# Patient Record
Sex: Male | Born: 1940 | Race: White | Hispanic: No | Marital: Single | Smoking: Former smoker
Health system: Southern US, Community
[De-identification: ages and names within clinical notes are randomized; demographics above are authoritative.]

---

## 2019-08-14 ENCOUNTER — Telehealth: Payer: Self-pay | Admitting: Gastroenterology

## 2019-08-14 NOTE — Telephone Encounter (Signed)
Thank you. I will review them when they are available.

## 2019-08-14 NOTE — Telephone Encounter (Signed)
Hi Dr. Tarri Glenn, we have received a referral from pt's PCP for a repeat colon. Pt had colon in 2013, his records will be placed on your desk for review. Please advise on scheduling.Thank you.

## 2019-08-15 ENCOUNTER — Encounter: Payer: Self-pay | Admitting: Gastroenterology

## 2019-08-18 ENCOUNTER — Telehealth: Payer: Self-pay | Admitting: Gastroenterology

## 2019-09-17 ENCOUNTER — Ambulatory Visit: Payer: Self-pay | Admitting: Gastroenterology

## 2020-01-19 ENCOUNTER — Ambulatory Visit (INDEPENDENT_AMBULATORY_CARE_PROVIDER_SITE_OTHER): Payer: Medicare Other | Admitting: Internal Medicine

## 2020-01-19 ENCOUNTER — Other Ambulatory Visit: Payer: Self-pay

## 2020-02-12 ENCOUNTER — Ambulatory Visit (INDEPENDENT_AMBULATORY_CARE_PROVIDER_SITE_OTHER): Payer: Medicare Other | Admitting: Internal Medicine

## 2020-02-12 ENCOUNTER — Other Ambulatory Visit: Payer: Self-pay

## 2020-02-12 ENCOUNTER — Encounter (INDEPENDENT_AMBULATORY_CARE_PROVIDER_SITE_OTHER): Payer: Self-pay | Admitting: Internal Medicine

## 2020-02-12 ENCOUNTER — Other Ambulatory Visit (INDEPENDENT_AMBULATORY_CARE_PROVIDER_SITE_OTHER): Payer: Self-pay | Admitting: Internal Medicine

## 2020-02-12 VITALS — BP 126/80 | HR 87 | Temp 97.6°F | Resp 18 | Ht 73.0 in | Wt 191.8 lb

## 2020-02-12 DIAGNOSIS — Z1211 Encounter for screening for malignant neoplasm of colon: Secondary | ICD-10-CM

## 2020-02-12 DIAGNOSIS — Z125 Encounter for screening for malignant neoplasm of prostate: Secondary | ICD-10-CM | POA: Diagnosis not present

## 2020-02-12 DIAGNOSIS — E119 Type 2 diabetes mellitus without complications: Secondary | ICD-10-CM | POA: Diagnosis not present

## 2020-02-12 DIAGNOSIS — I10 Essential (primary) hypertension: Secondary | ICD-10-CM

## 2020-02-12 DIAGNOSIS — R351 Nocturia: Secondary | ICD-10-CM

## 2020-02-12 DIAGNOSIS — E559 Vitamin D deficiency, unspecified: Secondary | ICD-10-CM

## 2020-02-12 DIAGNOSIS — E785 Hyperlipidemia, unspecified: Secondary | ICD-10-CM

## 2020-02-12 DIAGNOSIS — E782 Mixed hyperlipidemia: Secondary | ICD-10-CM

## 2020-02-12 HISTORY — DX: Type 2 diabetes mellitus without complications: E11.9

## 2020-02-12 HISTORY — DX: Hyperlipidemia, unspecified: E78.5

## 2020-02-12 HISTORY — DX: Essential (primary) hypertension: I10

## 2020-02-12 MED ORDER — METFORMIN HCL 500 MG PO TABS: 500.0000 mg | ORAL_TABLET | Freq: Two times a day (BID) | ORAL | 0 refills | Status: DC

## 2020-02-12 MED ORDER — SIMVASTATIN 40 MG PO TABS: 40.0000 mg | ORAL_TABLET | Freq: Every day | ORAL | 0 refills | Status: DC

## 2020-02-12 MED ORDER — LISINOPRIL 20 MG PO TABS: 20.0000 mg | ORAL_TABLET | Freq: Every day | ORAL | 0 refills | Status: DC

## 2020-02-12 NOTE — Progress Notes (Signed)
Metrics: Intervention Frequency ACO  Documented Smoking Status Yearly  Screened one or more times in 24 months  Cessation Counseling or  Active cessation medication Past 24 months  Past 24 months   Guideline developer: UpToDate (See UpToDate for funding source) Date Released: 2014       Wellness Office Visit  Subjective:  Patient ID: Adam Erickson, male    DOB: January 25, 1941  Age: 79 y.o. MRN: [id number]  CC: This 79 year old man comes to our practice as a new patient to be established. HPI  He is originally from [place].  He has diabetes, hypertension and hyperlipidemia.  Thankfully, he has no history of coronary artery disease, cerebrovascular disease. He does tell me he has a history of osteoporosis.  He does not have a history of any fracture. Past Medical History:  Diagnosis Date  . Diabetes mellitus without complication ([place]) [id number]  . Essential hypertension, benign 02/12/2020  . HLD (hyperlipidemia) 02/12/2020      Family History  Problem Relation Age of Onset  . Heart disease Mother   . Cancer Father   . Dementia Sister   . Cancer Brother   . Stroke Brother     Social History   Social History Narrative   Divorced in [place].Lives alone.Retired.High School Psychologist, prison and probation services.   Social History   Tobacco Use  . Smoking status: Former Smoker    Types: Cigars  . Smokeless tobacco: Never Used  Substance Use Topics  . Alcohol use: Not Currently    Current Meds  Medication Sig  . lisinopril (ZESTRIL) 20 MG tablet Take 20 mg by mouth daily.  . metFORMIN (GLUCOPHAGE) 500 MG tablet Take by mouth 2 (two) times daily with a meal.  . simvastatin (ZOCOR) 40 MG tablet Take 40 mg by mouth daily.      Objective:   Today's Vitals: BP 126/80 (BP Location: Right Arm, Patient Position: Sitting, Cuff Size: Normal)   Pulse 87   Temp 97.6 F (36.4 C) (Temporal)   Resp 18   Ht 6\' 1"  (1.854 m)   Wt 191 lb 12.8 oz (87 kg)   SpO2 99% Comment: wearing mask  BMI 25.30 kg/m    Vitals with BMI 02/12/2020  Height 6\' 1"   Weight 191 lbs 13 oz  BMI 123XX123  Systolic 123XX123  Diastolic 80  Pulse 87     Physical Exam   He looks systemically well.  Blood pressure is well controlled for his age.  He appears to be alert and orientated.    Assessment   1. Special screening for malignant neoplasm of prostate   2. Essential hypertension, benign   3. Diabetes mellitus without complication ([place])   4. Mixed hyperlipidemia   5. Vitamin D deficiency disease   6. Nocturia    7. Colon cancer screening       Tests ordered Orders Placed This Encounter  Procedures  . CBC  . COMPLETE METABOLIC PANEL WITH GFR  . Hemoglobin A1c  . Lipid panel  . PSA  . VITAMIN D 25 Hydroxy (Vit-D Deficiency, Fractures)  . T3, free  . TSH  . T4  . Fecal Globin By Immunochemistry     Plan: 1. Blood work is ordered above. 2. I have given him fecal occult blood testing as he does not wish to have a colonoscopy. 3. He will continue with all medications for his chronic conditions above.  These appear to be stable based on the medications he is taking but we will see what the  blood work shows.  He will continue with Metformin for his diabetes, lisinopril for his hypertension and statin therapy for his hyperlipidemia. 4. He does also apparently have a history of vitamin D deficiency and I will see what the results are before prescribing an appropriate dose for vitamin D3. 5. Further recommendations will depend on blood results and I will see him in about 2 to 3 weeks time for follow-up.   No orders of the defined types were placed in this encounter.   Doree Albee, MD

## 2020-02-13 LAB — COMPLETE METABOLIC PANEL WITH GFR
AG Ratio: 1.4 (calc) (ref 1.0–2.5)
ALT: 11 U/L (ref 9–46)
AST: 15 U/L (ref 10–35)
Albumin: 4.2 g/dL (ref 3.6–5.1)
Alkaline phosphatase (APISO): 107 U/L (ref 35–144)
BUN: 15 mg/dL (ref 7–25)
CO2: 27 mmol/L (ref 20–32)
Calcium: 9.4 mg/dL (ref 8.6–10.3)
Chloride: 105 mmol/L (ref 98–110)
Creat: 0.97 mg/dL (ref 0.70–1.18)
GFR, Est African American: 86 mL/min/{1.73_m2} (ref >=60)
GFR, Est Non African American: 74 mL/min/{1.73_m2} (ref >=60)
Globulin: 3 g/dL (calc) (ref 1.9–3.7)
Glucose, Bld: 252 mg/dL — ABNORMAL HIGH (ref 65–99)
Potassium: 4.7 mmol/L (ref 3.5–5.3)
Sodium: 139 mmol/L (ref 135–146)
Total Bilirubin: 0.4 mg/dL (ref 0.2–1.2)
Total Protein: 7.2 g/dL (ref 6.1–8.1)

## 2020-02-13 LAB — HEMOGLOBIN A1C
Hgb A1c MFr Bld: 10.2 % of total Hgb — ABNORMAL HIGH (ref <5.7)
Mean Plasma Glucose: 246 (calc)
eAG (mmol/L): 13.6 (calc)

## 2020-02-13 LAB — PSA: PSA: 11.4 ng/mL — ABNORMAL HIGH (ref <=4.0)

## 2020-02-13 LAB — CBC
HCT: 39.9 % (ref 38.5–50.0)
Hemoglobin: 12.9 g/dL — ABNORMAL LOW (ref 13.2–17.1)
MCH: 28.7 pg (ref 27.0–33.0)
MCHC: 32.3 g/dL (ref 32.0–36.0)
MCV: 88.7 fL (ref 80.0–100.0)
MPV: 10 fL (ref 7.5–12.5)
Platelets: 293 10*3/uL (ref 140–400)
RBC: 4.5 10*6/uL (ref 4.20–5.80)
RDW: 11.8 % (ref 11.0–15.0)
WBC: 6.5 10*3/uL (ref 3.8–10.8)

## 2020-02-13 LAB — T4: T4, Total: 8.2 ug/dL (ref 4.9–10.5)

## 2020-02-13 LAB — VITAMIN D 25 HYDROXY (VIT D DEFICIENCY, FRACTURES): Vit D, 25-Hydroxy: 19 ng/mL — ABNORMAL LOW (ref 30–100)

## 2020-02-13 LAB — TSH: TSH: 2.01 mIU/L (ref 0.40–4.50)

## 2020-02-13 LAB — T3, FREE: T3, Free: 2.9 pg/mL (ref 2.3–4.2)

## 2020-03-09 ENCOUNTER — Ambulatory Visit (INDEPENDENT_AMBULATORY_CARE_PROVIDER_SITE_OTHER): Payer: Medicare Other | Admitting: Internal Medicine

## 2020-03-09 ENCOUNTER — Other Ambulatory Visit: Payer: Self-pay

## 2020-03-09 ENCOUNTER — Encounter (INDEPENDENT_AMBULATORY_CARE_PROVIDER_SITE_OTHER): Payer: Self-pay | Admitting: Internal Medicine

## 2020-03-09 VITALS — BP 120/80 | HR 60 | Temp 98.2°F | Ht 73.0 in | Wt 190.2 lb

## 2020-03-09 DIAGNOSIS — R972 Elevated prostate specific antigen [PSA]: Secondary | ICD-10-CM | POA: Diagnosis not present

## 2020-03-09 DIAGNOSIS — Z1211 Encounter for screening for malignant neoplasm of colon: Secondary | ICD-10-CM

## 2020-03-09 DIAGNOSIS — E559 Vitamin D deficiency, unspecified: Secondary | ICD-10-CM | POA: Diagnosis not present

## 2020-03-09 DIAGNOSIS — E119 Type 2 diabetes mellitus without complications: Secondary | ICD-10-CM

## 2020-03-09 NOTE — Progress Notes (Signed)
Metrics: Intervention Frequency ACO  Documented Smoking Status Yearly  Screened one or more times in 24 months  Cessation Counseling or  Active cessation medication Past 24 months  Past 24 months   Guideline developer: UpToDate (See UpToDate for funding source) Date Released: 2014       Wellness Office Visit  Subjective:  Patient ID: Adam Erickson, male    DOB: 1941-03-08  Age: 79 y.o. MRN: [id number]  CC: This man comes in to review all his blood work from the last visit and further recommendations. HPI  His diabetes is poorly controlled with a hemoglobin A1c of over 10%.  He has not been consistent with taking Metformin twice a day and he will do so now. He also has vitamin D deficiency which is very severe. He also has an elevated PSA above the level which is worrisome as he has a family history of prostate cancer.  He is also anemic which is mild.  It is normocytic. Past Medical History:  Diagnosis Date  . Diabetes mellitus without complication ([place]) A999333  . Essential hypertension, benign 02/12/2020  . HLD (hyperlipidemia) 02/12/2020      Family History  Problem Relation Age of Onset  . Heart disease Mother   . Cancer Father   . Dementia Sister   . Cancer Brother   . Stroke Brother     Social History   Social History Narrative   Divorced in [place].Lives alone.Retired.High School Psychologist, prison and probation services.   Social History   Tobacco Use  . Smoking status: Former Smoker    Types: Cigars  . Smokeless tobacco: Never Used  Substance Use Topics  . Alcohol use: Not Currently    Current Meds  Medication Sig  . lisinopril (ZESTRIL) 20 MG tablet Take 1 tablet (20 mg total) by mouth daily.  . metFORMIN (GLUCOPHAGE) 500 MG tablet Take 1 tablet (500 mg total) by mouth 2 (two) times daily with a meal.  . simvastatin (ZOCOR) 40 MG tablet Take 1 tablet (40 mg total) by mouth daily.       Objective:   Today's Vitals: BP 120/80 (BP Location: Left Arm, Patient Position:  Sitting, Cuff Size: Normal)   Pulse 60   Temp 98.2 F (36.8 C) (Temporal)   Ht 6\' 1"  (1.854 m)   Wt 190 lb 3.2 oz (86.3 kg)   SpO2 97%   BMI 25.09 kg/m  Vitals with BMI 03/09/2020 02/12/2020  Height 6\' 1"  6\' 1"   Weight 190 lbs 3 oz 191 lbs 13 oz  BMI 99991111 123XX123  Systolic 123456 123XX123  Diastolic 80 80  Pulse 60 87     Physical Exam   He looks systemically well.  Blood pressure is well controlled.  Weight is stable.    Assessment   1. Elevated PSA   2. Diabetes mellitus without complication ([place])   3. Vitamin D deficiency disease   4. Colon cancer screening       Tests ordered Orders Placed This Encounter  Procedures  . PSA, total and free  . Ambulatory referral to Urology  . Ambulatory referral to Gastroenterology     Plan: 1. I discussed all the results in detail. 2. I am concerned about his elevated PSA and we will check a total PSA again and free PSA to see and get a better idea of whether the elevation in PSA is likely malignant or not.  Also I will refer him to the urologist in the meantime. 3. He wishes to have a  colonoscopy and I will refer him to gastroenterology for screening colonoscopy. 4. I recommended he start taking vitamin D3 10,000 units daily for vitamin D deficiency. 5. In terms of his diabetes, he will start taking Metformin twice a day as he should have been and also modify his diet.  He will cut out sugary foods, make sure he is drinking plenty of water every day and we discussed the concept of intermittent fasting and if he can do this for 16 hours on a daily basis, he will start to get his diabetes under better control.  At his age, I am not going to aim for very strict control as he seems to have really avoided many of severe complications of poor control of diabetes. 6. Follow-up in about 3 months. 7. Today I spent 30 minutes with this patient, reviewing all his results and discussing them in detail.   No orders of the defined types were placed  in this encounter.   Doree Albee, MD

## 2020-03-09 NOTE — Patient Instructions (Signed)
[facility] Dietary Recommendations for Weight Loss What to Avoid . Avoid added sugars o Often added sugar can be found in processed foods such as many condiments, dry cereals, cakes, cookies, chips, crisps, crackers, candies, sweetened drinks, etc.  o Read labels and AVOID/DECREASE use of foods with the following in their ingredient list: Sugar, fructose, high fructose corn syrup, sucrose, glucose, maltose, dextrose, molasses, cane sugar, brown sugar, any type of syrup, agave nectar, etc.   . Avoid snacking in between meals . Avoid foods made with flour o If you are going to eat food made with flour, choose those made with whole-grains; and, minimize your consumption as much as is tolerable . Avoid processed foods o These foods are generally stocked in the middle of the grocery store. Focus on shopping on the perimeter of the grocery.  . Avoid Meat  o We recommend following a plant-based diet at [facility]. Thus, we recommend avoiding meat as a general rule. Consider eating beans, legumes, eggs, and/or dairy products for regular protein sources o If you plan on eating meat limit to 4 ounces of meat at a time and choose lean options such as Fish, chicken, turkey. Avoid red meat intake such as pork and/or steak What to Include . Vegetables o GREEN LEAFY VEGETABLES: Kale, spinach, mustard greens, collard greens, cabbage, broccoli, etc. o OTHER: Asparagus, cauliflower, eggplant, carrots, peas, Brussel sprouts, tomatoes, bell peppers, zucchini, beets, cucumbers, etc. . Grains, seeds, and legumes o Beans: kidney beans, black eyed peas, garbanzo beans, black beans, pinto beans, etc. o Whole, unrefined grains: brown rice, barley, bulgur, oatmeal, etc. . Healthy fats  o Avoid highly processed fats such as vegetable oil o Examples of healthy fats: avocado, olives, virgin olive oil, dark chocolate (?72% Cocoa), nuts (peanuts, almonds, walnuts, cashews, pecans, etc.) . None to Low  Intake of Animal Sources of Protein o Meat sources: chicken, turkey, salmon, tuna. Limit to 4 ounces of meat at one time. o Consider limiting dairy sources, but when choosing dairy focus on: PLAIN Greek yogurt, cottage cheese, high-protein milk . Fruit o Choose berries  When to Eat . Intermittent Fasting: o Choosing not to eat for a specific time period, but DO FOCUS ON HYDRATION when fasting o Multiple Techniques: - Time Restricted Eating: eat 3 meals in a day, each meal lasting no more than 60 minutes, no snacks between meals - 16-18 hour fast: fast for 16 to 18 hours up to 7 days a week. Often suggested to start with 2-3 nonconsecutive days per week.  . Remember the time you sleep is counted as fasting.  . Examples of eating schedule: Fast from 7:00pm-11:00am. Eat between 11:00am-7:00pm.  - 24-hour fast: fast for 24 hours up to every other day. Often suggested to start with 1 day per week . Remember the time you sleep is counted as fasting . Examples of eating schedule:  o Eating day: eat 2-3 meals on your eating day. If doing 2 meals, each meal should last no more than 90 minutes. If doing 3 meals, each meal should last no more than 60 minutes. Finish last meal by 7:00pm. o Fasting day: Fast until 7:00pm.  o IF YOU FEEL UNWELL FOR ANY REASON/IN ANY WAY WHEN FASTING, STOP FASTING BY EATING A NUTRITIOUS SNACK OR LIGHT MEAL o ALWAYS FOCUS ON HYDRATION DURING FASTS - Acceptable Hydration sources: water, broths, tea/coffee (black tea/coffee is best but using a small amount of whole-fat dairy products in coffee/tea is acceptable).  -   Poor Hydration Sources: anything with sugar or artificial sweeteners added to it  These recommendations have been developed for patients that are actively receiving medical care from either Dr. Normagene Harvie or Sarah Gray, DNP, NP-C at [facility]. These recommendations are developed for patients with specific medical conditions and are not meant to be  distributed or used by others that are not actively receiving care from either provider listed above at [facility]. It is not appropriate to participate in the above eating plans without proper medical supervision.   Reference: Fung, J. The obesity code. [place]: [place]; 2016.   VITAMIN D3 10,000 UNITS/DAY 

## 2020-03-10 ENCOUNTER — Encounter (INDEPENDENT_AMBULATORY_CARE_PROVIDER_SITE_OTHER): Payer: Self-pay | Admitting: *Deleted

## 2020-03-10 LAB — PSA, TOTAL AND FREE
PSA, % Free: 38 % (calc) (ref >25)
PSA, Free: 1 ng/mL
PSA, Total: 2.6 ng/mL (ref <=4.0)

## 2020-03-11 NOTE — Progress Notes (Signed)
Patient called. Instruction to patient and let him know that his PSA results are completely normal now and he already has an appointment with urology so it is his choice whether he wants to keep it or cancel it. Pt will canel apptt and see [place]  on next visit. Follow-up as scheduled

## 2020-04-21 ENCOUNTER — Ambulatory Visit: Payer: Medicare Other | Admitting: Urology

## 2020-05-26 ENCOUNTER — Other Ambulatory Visit (INDEPENDENT_AMBULATORY_CARE_PROVIDER_SITE_OTHER): Payer: Self-pay | Admitting: *Deleted

## 2020-05-26 DIAGNOSIS — Z86010 Personal history of colonic polyps: Secondary | ICD-10-CM

## 2020-05-27 ENCOUNTER — Telehealth (INDEPENDENT_AMBULATORY_CARE_PROVIDER_SITE_OTHER): Payer: Self-pay | Admitting: *Deleted

## 2020-05-27 ENCOUNTER — Encounter (INDEPENDENT_AMBULATORY_CARE_PROVIDER_SITE_OTHER): Payer: Self-pay | Admitting: *Deleted

## 2020-05-27 NOTE — Telephone Encounter (Signed)
Patient needs Plenvu (copay card) ° °

## 2020-05-28 ENCOUNTER — Telehealth (INDEPENDENT_AMBULATORY_CARE_PROVIDER_SITE_OTHER): Payer: Self-pay | Admitting: *Deleted

## 2020-05-28 MED ORDER — PLENVU 140 G PO SOLR
1.0000 | Freq: Once | ORAL | 0 refills | Status: AC
Start: 2020-05-28 — End: 2020-05-28

## 2020-05-28 NOTE — Telephone Encounter (Signed)
Referring MD/PCP: gosrani   Procedure: tcs  Reason/Indication:  Hx polyps  Has patient had this procedure before?  Yes, not sure  If so, when, by whom and where?    Is there a family history of colon cancer?  no  Who?  What age when diagnosed?    Is patient diabetic?   yes      Does patient have prosthetic heart valve or mechanical valve?  no  Do you have a pacemaker/defibrillator?  no  Has patient ever had endocarditis/atrial fibrillation? no  Does patient use oxygen? no  Has patient had joint replacement within last 12 months?  no  Is patient constipated or do they take laxatives? no  Does patient have a history of alcohol/drug use?  no  Is patient on blood thinner such as Coumadin, Plavix and/or Aspirin? no  Medications: metformin 500 mg bid, lisinopril 20 mg daily, simvastatin 40 mg daily  Allergies: nkda  Medication Adjustment per Dr Rehman/Dr Jenetta Downer hold diabetic meds evening before and morning of  Procedure date & time: 07/08/20

## 2020-06-09 ENCOUNTER — Other Ambulatory Visit (INDEPENDENT_AMBULATORY_CARE_PROVIDER_SITE_OTHER): Payer: Self-pay

## 2020-06-09 ENCOUNTER — Encounter (INDEPENDENT_AMBULATORY_CARE_PROVIDER_SITE_OTHER): Payer: Self-pay | Admitting: Internal Medicine

## 2020-06-09 ENCOUNTER — Other Ambulatory Visit: Payer: Self-pay

## 2020-06-09 ENCOUNTER — Ambulatory Visit (INDEPENDENT_AMBULATORY_CARE_PROVIDER_SITE_OTHER): Payer: Medicare Other | Admitting: Internal Medicine

## 2020-06-09 VITALS — BP 120/65 | HR 81 | Temp 97.7°F | Ht 73.0 in | Wt 178.8 lb

## 2020-06-09 DIAGNOSIS — E559 Vitamin D deficiency, unspecified: Secondary | ICD-10-CM

## 2020-06-09 DIAGNOSIS — E782 Mixed hyperlipidemia: Secondary | ICD-10-CM

## 2020-06-09 DIAGNOSIS — E119 Type 2 diabetes mellitus without complications: Secondary | ICD-10-CM

## 2020-06-09 DIAGNOSIS — I10 Essential (primary) hypertension: Secondary | ICD-10-CM

## 2020-06-09 MED ORDER — LISINOPRIL 20 MG PO TABS: 20.0000 mg | ORAL_TABLET | Freq: Every day | ORAL | 1 refills | Status: DC

## 2020-06-09 MED ORDER — SIMVASTATIN 40 MG PO TABS: 40.0000 mg | ORAL_TABLET | Freq: Every day | ORAL | 1 refills | Status: DC

## 2020-06-09 MED ORDER — METFORMIN HCL 500 MG PO TABS: 500.0000 mg | ORAL_TABLET | Freq: Two times a day (BID) | ORAL | 1 refills | Status: DC

## 2020-06-09 NOTE — Progress Notes (Signed)
Metrics: Intervention Frequency ACO  Documented Smoking Status Yearly  Screened one or more times in 24 months  Cessation Counseling or  Active cessation medication Past 24 months  Past 24 months   Guideline developer: UpToDate (See UpToDate for funding source) Date Released: 2014       Wellness Office Visit  Subjective:  Patient ID: Adam Erickson, male    DOB: 1941-10-10  Age: 79 y.o. MRN: [id number]  CC: This man comes in for follow-up of hypertension, diabetes, hyperlipidemia and vitamin D deficiency. HPI  He has no complaints.  He has been compliant with lisinopril for his hypertension. He also is compliant with Metformin for his diabetes and his last hemoglobin A1c showed poor control over 10%.  Hopefully, he is made some changes to his diet.  He appears to have lost some weight. He continues on simvastatin for his hyperlipidemia in the face of diabetes. He has been taking vitamin D3 5000 units daily for vitamin D deficiency. Past Medical History:  Diagnosis Date  . Diabetes mellitus without complication ([place]) 9/79/8921  . Essential hypertension, benign 02/12/2020  . HLD (hyperlipidemia) 02/12/2020   History reviewed. No pertinent surgical history.   Family History  Problem Relation Age of Onset  . Heart disease Mother   . Cancer Father   . Dementia Sister   . Cancer Brother   . Stroke Brother     Social History   Social History Narrative   Divorced in [place].Lives alone.Retired.High School Psychologist, prison and probation services.   Social History   Tobacco Use  . Smoking status: Former Smoker    Types: Cigars  . Smokeless tobacco: Never Used  Substance Use Topics  . Alcohol use: Not Currently    Current Meds  Medication Sig  . Cholecalciferol (VITAMIN D3) 125 MCG (5000 UT) TABS Take 1 tablet by mouth daily.  Marland Kitchen lisinopril (ZESTRIL) 20 MG tablet Take 1 tablet (20 mg total) by mouth daily.  . metFORMIN (GLUCOPHAGE) 500 MG tablet Take 1 tablet (500 mg total) by mouth 2 (two) times  daily with a meal.  . simvastatin (ZOCOR) 40 MG tablet Take 1 tablet (40 mg total) by mouth daily.  . [DISCONTINUED] lisinopril (ZESTRIL) 20 MG tablet Take 1 tablet (20 mg total) by mouth daily.  . [DISCONTINUED] metFORMIN (GLUCOPHAGE) 500 MG tablet Take 1 tablet (500 mg total) by mouth 2 (two) times daily with a meal.  . [DISCONTINUED] simvastatin (ZOCOR) 40 MG tablet Take 1 tablet (40 mg total) by mouth daily.      Depression screen [facility] 2/9 03/09/2020  Decreased Interest 0  Down, Depressed, Hopeless 0  PHQ - 2 Score 0     Objective:   Today's Vitals: BP 120/65 (BP Location: Left Arm, Patient Position: Sitting, Cuff Size: Normal)   Pulse 81   Temp 97.7 F (36.5 C) (Temporal)   Ht 6\' 1"  (1.854 m)   Wt 178 lb 12.8 oz (81.1 kg)   SpO2 96%   BMI 23.59 kg/m  Vitals with BMI 06/09/2020 03/09/2020 02/12/2020  Height 6\' 1"  6\' 1"  6\' 1"   Weight 178 lbs 13 oz 190 lbs 3 oz 191 lbs 13 oz  BMI 23.59 19.4 17.40  Systolic 814 481 856  Diastolic 65 80 80  Pulse 81 60 87     Physical Exam   He looks systemically well.  His blood pressure is very well controlled.  He has lost 12 pounds in weight since last visit.    Assessment   1. Diabetes mellitus  without complication ([place])   2. Essential hypertension, benign   3. Mixed hyperlipidemia   4. Vitamin D deficiency disease       Tests ordered Orders Placed This Encounter  Procedures  . COMPLETE METABOLIC PANEL WITH GFR  . Hemoglobin A1c  . Lipid panel  . VITAMIN D 25 Hydroxy (Vit-D Deficiency, Fractures)     Plan: 1. Blood work is ordered. 2. He will continue with Metformin for his diabetes and we will see what his A1c is. 3. He will continue with lisinopril which appears to have kept his blood pressure under good control and we will check renal function today. 4. He will continue with simvastatin for his hyperlipidemia and I will check a lipid panel today. 5. He will continue with vitamin D3 supplementation and I will  check vitamin D levels today. 6. Further recommendations will depend on blood results and I will have Sarah follow-up with him in 3 months time.   Meds ordered this encounter  Medications  . lisinopril (ZESTRIL) 20 MG tablet    Sig: Take 1 tablet (20 mg total) by mouth daily.    Dispense:  90 tablet    Refill:  1  . metFORMIN (GLUCOPHAGE) 500 MG tablet    Sig: Take 1 tablet (500 mg total) by mouth 2 (two) times daily with a meal.    Dispense:  180 tablet    Refill:  1  . simvastatin (ZOCOR) 40 MG tablet    Sig: Take 1 tablet (40 mg total) by mouth daily.    Dispense:  90 tablet    Refill:  1    Nickolette Espinola Luther Parody, MD

## 2020-06-11 LAB — COMPLETE METABOLIC PANEL WITH GFR
AG Ratio: 1.7 (calc) (ref 1.0–2.5)
ALT: 8 U/L — ABNORMAL LOW (ref 9–46)
AST: 15 U/L (ref 10–35)
Albumin: 4.3 g/dL (ref 3.6–5.1)
Alkaline phosphatase (APISO): 74 U/L (ref 35–144)
BUN: 24 mg/dL (ref 7–25)
CO2: 25 mmol/L (ref 20–32)
Calcium: 9.4 mg/dL (ref 8.6–10.3)
Chloride: 106 mmol/L (ref 98–110)
Creat: 0.91 mg/dL (ref 0.70–1.18)
GFR, Est African American: 93 mL/min/{1.73_m2} (ref >=60)
GFR, Est Non African American: 80 mL/min/{1.73_m2} (ref >=60)
Globulin: 2.6 g/dL (calc) (ref 1.9–3.7)
Glucose, Bld: 127 mg/dL — ABNORMAL HIGH (ref 65–99)
Potassium: 4.2 mmol/L (ref 3.5–5.3)
Sodium: 139 mmol/L (ref 135–146)
Total Bilirubin: 0.8 mg/dL (ref 0.2–1.2)
Total Protein: 6.9 g/dL (ref 6.1–8.1)

## 2020-06-11 LAB — HEMOGLOBIN A1C
Hgb A1c MFr Bld: 7.2 % of total Hgb — ABNORMAL HIGH (ref <5.7)
Mean Plasma Glucose: 160 (calc)
eAG (mmol/L): 8.9 (calc)

## 2020-06-14 ENCOUNTER — Encounter (INDEPENDENT_AMBULATORY_CARE_PROVIDER_SITE_OTHER): Payer: Self-pay

## 2020-06-14 NOTE — Progress Notes (Signed)
Pt would like  Copy to better understand blood work. Printed copy and sent via mail.

## 2020-06-14 NOTE — Progress Notes (Signed)
Please call the patient and let him know that his diabetes is much improved compared to last time so keep up the good work.  Follow-up as scheduled.

## 2020-06-14 NOTE — Progress Notes (Signed)
Pt was called given information.  

## 2020-06-14 NOTE — Progress Notes (Signed)
Patient called. Given lab results. Pt wanted to know if he should continue with the diet of eating after 12 noon. He is only eating 2 meals a day. And drinking fluids.

## 2020-07-06 ENCOUNTER — Other Ambulatory Visit: Payer: Self-pay

## 2020-07-06 ENCOUNTER — Other Ambulatory Visit (HOSPITAL_COMMUNITY)
Admission: RE | Admit: 2020-07-06 | Discharge: 2020-07-06 | Disposition: A | Discharge status: Home / Self Care | Payer: Medicare Other | Source: Ambulatory Visit | Attending: Internal Medicine | Admitting: Internal Medicine

## 2020-07-06 DIAGNOSIS — Z01812 Encounter for preprocedural laboratory examination: Secondary | ICD-10-CM | POA: Insufficient documentation

## 2020-07-06 DIAGNOSIS — Z20822 Contact with and (suspected) exposure to covid-19: Secondary | ICD-10-CM | POA: Insufficient documentation

## 2020-07-07 LAB — SARS CORONAVIRUS 2 (TAT 6-24 HRS): SARS Coronavirus 2: NEGATIVE

## 2020-07-08 ENCOUNTER — Other Ambulatory Visit: Payer: Self-pay

## 2020-07-08 ENCOUNTER — Ambulatory Visit (HOSPITAL_COMMUNITY)
Admission: RE | Admit: 2020-07-08 | Discharge: 2020-07-08 | Disposition: A | Discharge status: Home / Self Care | Payer: Medicare Other | Attending: Internal Medicine | Admitting: Internal Medicine

## 2020-07-08 ENCOUNTER — Encounter (HOSPITAL_COMMUNITY): Payer: Self-pay | Admitting: Internal Medicine

## 2020-07-08 ENCOUNTER — Encounter (HOSPITAL_COMMUNITY)
Admission: RE | Disposition: A | Discharge status: Home / Self Care | Payer: Self-pay | Source: Home / Self Care | Attending: Internal Medicine

## 2020-07-08 DIAGNOSIS — Z09 Encounter for follow-up examination after completed treatment for conditions other than malignant neoplasm: Secondary | ICD-10-CM | POA: Diagnosis not present

## 2020-07-08 DIAGNOSIS — D123 Benign neoplasm of transverse colon: Secondary | ICD-10-CM | POA: Insufficient documentation

## 2020-07-08 DIAGNOSIS — E119 Type 2 diabetes mellitus without complications: Secondary | ICD-10-CM | POA: Diagnosis not present

## 2020-07-08 DIAGNOSIS — I10 Essential (primary) hypertension: Secondary | ICD-10-CM | POA: Diagnosis not present

## 2020-07-08 DIAGNOSIS — Z8249 Family history of ischemic heart disease and other diseases of the circulatory system: Secondary | ICD-10-CM | POA: Insufficient documentation

## 2020-07-08 DIAGNOSIS — Z87891 Personal history of nicotine dependence: Secondary | ICD-10-CM | POA: Diagnosis not present

## 2020-07-08 DIAGNOSIS — Z8719 Personal history of other diseases of the digestive system: Secondary | ICD-10-CM | POA: Insufficient documentation

## 2020-07-08 DIAGNOSIS — Z79899 Other long term (current) drug therapy: Secondary | ICD-10-CM | POA: Insufficient documentation

## 2020-07-08 DIAGNOSIS — Z7984 Long term (current) use of oral hypoglycemic drugs: Secondary | ICD-10-CM | POA: Insufficient documentation

## 2020-07-08 DIAGNOSIS — Z86010 Personal history of colonic polyps: Secondary | ICD-10-CM

## 2020-07-08 DIAGNOSIS — Z1211 Encounter for screening for malignant neoplasm of colon: Secondary | ICD-10-CM | POA: Insufficient documentation

## 2020-07-08 DIAGNOSIS — E785 Hyperlipidemia, unspecified: Secondary | ICD-10-CM | POA: Diagnosis not present

## 2020-07-08 DIAGNOSIS — D125 Benign neoplasm of sigmoid colon: Secondary | ICD-10-CM | POA: Insufficient documentation

## 2020-07-08 HISTORY — PX: COLONOSCOPY: SHX5424

## 2020-07-08 HISTORY — PX: POLYPECTOMY: SHX5525

## 2020-07-08 LAB — GLUCOSE, CAPILLARY: Glucose-Capillary: 110 mg/dL — ABNORMAL HIGH (ref 70–99)

## 2020-07-08 SURGERY — COLONOSCOPY
Anesthesia: Moderate Sedation

## 2020-07-08 MED ORDER — MEPERIDINE HCL 50 MG/ML IJ SOLN
INTRAMUSCULAR | Status: AC
  Filled 2020-07-08: qty 1

## 2020-07-08 MED ORDER — STERILE WATER FOR IRRIGATION IR SOLN
Status: DC | PRN
  Administered 2020-07-08: 5 mL

## 2020-07-08 MED ORDER — SODIUM CHLORIDE 0.9 % IV SOLN: INTRAVENOUS | Status: DC

## 2020-07-08 MED ORDER — MEPERIDINE HCL 50 MG/ML IJ SOLN
INTRAMUSCULAR | Status: DC | PRN
  Administered 2020-07-08: 25 mg

## 2020-07-08 MED ORDER — MIDAZOLAM HCL 5 MG/5ML IJ SOLN
INTRAMUSCULAR | Status: DC | PRN
  Administered 2020-07-08: 2 mg via INTRAVENOUS
  Administered 2020-07-08 (×2): 1 mg via INTRAVENOUS

## 2020-07-08 MED ORDER — MIDAZOLAM HCL 5 MG/5ML IJ SOLN
INTRAMUSCULAR | Status: AC
  Filled 2020-07-08: qty 10

## 2020-07-08 NOTE — H&P (Signed)
Adam Erickson is an 79 y.o. male.   Chief Complaint: Adam Erickson is here for colonoscopy. HPI: Adam Erickson is 79 year old Caucasian male who has history of colonic polyps and is here for surveillance colonoscopy.  He had 2 polyps on his first exam but not on the second exam.  His last exam was more than 5 years ago. He denies abdominal pain change in bowel habits or rectal bleeding. He does not take aspirin or anticoagulants. Family history is negative for CRC.  Past Medical History:  Diagnosis Date  . Diabetes mellitus without complication ([place]) 8/78/6767  . Essential hypertension, benign 02/12/2020  . HLD (hyperlipidemia) 02/12/2020    History reviewed. No pertinent surgical history.  Family History  Problem Relation Age of Onset  . Heart disease Mother   . Cancer Father   . Dementia Sister   . Cancer prostate Brother   . Stroke Brother    Social History:  reports that he has quit smoking. His smoking use included cigars. He has never used smokeless tobacco. He reports previous alcohol use. No history on file for drug use.  Allergies: No Known Allergies  Medications Prior to Admission  Medication Sig Dispense Refill  . Cholecalciferol (VITAMIN D3) 125 MCG (5000 UT) TABS Take 1 tablet by mouth daily.    Marland Kitchen lisinopril (ZESTRIL) 20 MG tablet Take 1 tablet (20 mg total) by mouth daily. 90 tablet 1  . metFORMIN (GLUCOPHAGE) 500 MG tablet Take 1 tablet (500 mg total) by mouth 2 (two) times daily with a meal. 180 tablet 1  . simvastatin (ZOCOR) 40 MG tablet Take 1 tablet (40 mg total) by mouth daily. 90 tablet 1    Results for orders placed or performed during the hospital encounter of 07/08/20 (from the past 48 hour(s))  Glucose, capillary     Status: Abnormal   Collection Time: 07/08/20  8:35 AM  Result Value Ref Range   Glucose-Capillary 110 (H) 70 - 99 mg/dL    Comment: Glucose reference range applies only to samples taken after fasting for at least 8 hours.   No results  found.  Review of Systems  Blood pressure 133/60, pulse 60, temperature 97.8 F (36.6 C), temperature source Oral, resp. rate 12, height 6' (1.829 m), weight 81.2 kg. Physical Exam HENT:     Mouth/Throat:     Mouth: Mucous membranes are moist.     Pharynx: Oropharynx is clear.  Eyes:     General: No scleral icterus.    Conjunctiva/sclera: Conjunctivae normal.  Cardiovascular:     Rate and Rhythm: Normal rate and regular rhythm.     Heart sounds: Normal heart sounds. No murmur heard.   Pulmonary:     Effort: Pulmonary effort is normal.     Breath sounds: Normal breath sounds.  Abdominal:     General: There is no distension.     Palpations: Abdomen is soft. There is no mass.     Tenderness: There is no abdominal tenderness.  Musculoskeletal:        General: No swelling.     Cervical back: Neck supple.  Lymphadenopathy:     Cervical: No cervical adenopathy.  Skin:    General: Skin is warm and dry.  Neurological:     Mental Status: He is alert.     Assessment/Plan History of colonic polyps. Surveillance colonoscopy.  Hildred Laser, MD 07/08/2020, 8:54 AM

## 2020-07-08 NOTE — OR Nursing (Signed)
Actual medication given of demerol 20 witnessed by Janeece Riggers, RN

## 2020-07-08 NOTE — Op Note (Signed)
[facility] Patient Name: Adam Erickson Procedure Date: 07/08/2020 8:41 AM MRN: [id number] Date of Birth: 1940/12/31 Attending MD: Hildred Laser , MD CSN: [id number] Age: 79 Admit Type: Outpatient Procedure:                Colonoscopy Indications:              High risk colon cancer surveillance: Personal                            history of colonic polyps Providers:                Hildred Laser, MD, Janeece Riggers, RN, Randa Spike,                            Technician Referring MD:             Doree Albee, MD Medicines:                Midazolam 4 mg IV, Meperidine 20 mg IV Complications:            No immediate complications. Estimated Blood Loss:     Estimated blood loss was minimal. Procedure:                Pre-Anesthesia Assessment:                           - Prior to the procedure, a History and Physical                            was performed, and patient medications and                            allergies were reviewed. The patient's tolerance of                            previous anesthesia was also reviewed. The risks                            and benefits of the procedure and the sedation                            options and risks were discussed with the patient.                            All questions were answered, and informed consent                            was obtained. Prior Anticoagulants: The patient has                            taken no previous anticoagulant or antiplatelet                            agents. ASA Grade Assessment: II - A patient with  mild systemic disease. After reviewing the risks                            and benefits, the patient was deemed in                            satisfactory condition to undergo the procedure.                           After obtaining informed consent, the colonoscope                            was passed under direct vision. Throughout the                             procedure, the patient's blood pressure, pulse, and                            oxygen saturations were monitored continuously. The                            [id number]-[id number] ([id number]) scope was introduced through                            the anus and advanced to the the cecum, identified                            by appendiceal orifice and ileocecal valve. The                            colonoscopy was performed without difficulty. The                            patient tolerated the procedure well. The quality                            of the bowel preparation was marginal. The                            ileocecal valve, appendiceal orifice, and rectum                            were photographed. Scope In: 9:02:25 AM Scope Out: 9:22:53 AM Scope Withdrawal Time: 0 hours 6 minutes 49 seconds  Total Procedure Duration: 0 hours 20 minutes 28 seconds  Findings:      The perianal and digital rectal examinations were normal.      A 6 mm polyp was found in the hepatic flexure. The polyp was sessile.       The polyp was removed with a cold snare. Resection and retrieval were       complete. The pathology specimen was placed into Bottle Number 1.      A small polyp was found in the distal sigmoid colon. The polyp was       sessile. The polyp was removed with a  cold snare. Resection and       retrieval were complete. The pathology specimen was placed into Bottle       Number 1.      The retroflexed view of the distal rectum and anal verge was normal and       showed no anal or rectal abnormalities. Impression:               - Preparation of the colon was fair.                           - One 6 mm polyp at the hepatic flexure, removed                            with a cold snare. Resected and retrieved.                           - One small polyp in the distal sigmoid colon,                            removed with a cold snare. Resected and retrieved.                           comment: marginal  prep. Moderate Sedation:      Moderate (conscious) sedation was administered by the endoscopy nurse       and supervised by the endoscopist. The following parameters were       monitored: oxygen saturation, heart rate, blood pressure, CO2       capnography and response to care. Total physician intraservice time was       22 minutes. Recommendation:           - Patient has a contact number available for                            emergencies. The signs and symptoms of potential                            delayed complications were discussed with the                            patient. Return to normal activities tomorrow.                            Written discharge instructions were provided to the                            patient.                           - Resume previous diet today.                           - Continue present medications.                           - No aspirin, ibuprofen, naproxen, or other  non-steroidal anti-inflammatory drugs for 1 day.                           - Await pathology results.                           - No recommendation at this time regarding repeat                            colonoscopy. Procedure Code(s):        --- Professional ---                           [phone], Colonoscopy, flexible; with removal of                            tumor(s), polyp(s), or other lesion(s) by snare                            technique                           G0500, Moderate sedation services provided by the                            same physician or other qualified health care                            professional performing a gastrointestinal                            endoscopic service that sedation supports,                            requiring the presence of an independent trained                            observer to assist in the monitoring of the                            patient's level of consciousness and physiological                             status; initial 15 minutes of intra-service time;                            patient age 85 years or older (additional time may                            be reported with [phone], as appropriate) Diagnosis Code(s):        --- Professional ---                           Z86.010, Personal history of colonic polyps  K63.5, Polyp of colon CPT copyright 2019 American Medical Association. All rights reserved. The codes documented in this report are preliminary and upon coder review may  be revised to meet current compliance requirements. Hildred Laser, MD Hildred Laser, MD 07/08/2020 9:31:00 AM This report has been signed electronically. Number of Addenda: 0

## 2020-07-08 NOTE — Discharge Instructions (Signed)
Resume usual medications and diet as before. No driving for 24 hours. Physician will call with biopsy results.   Colonoscopy, Adult, Care After This sheet gives you information about how to care for yourself after your procedure. Your health care provider may also give you more specific instructions. If you have problems or questions, contact your health care provider.  Dr Laural Golden:  [phone]  What can I expect after the procedure? After the procedure, it is common to have:  A small amount of blood in your stool for 24 hours after the procedure.  Some gas.  Mild cramping or bloating of your abdomen. Follow these instructions at home: Eating and drinking   Drink enough fluid to keep your urine pale yellow.  Resume your normal diet  Activity  Rest as told by your health care provider.  Avoid sitting for a long time without moving. Get up to take short walks every 1-2 hours. This is important to improve blood flow and breathing. Ask for help if you feel weak or unsteady.  Managing cramping and bloating   Try walking around when you have cramps or feel bloated.  General instructions  For the first 24 hours after the procedure: ? Do not drive or use machinery. ? Do not sign important documents. ? Do not drink alcohol. ? Do your regular daily activities at a slower pace than normal. ? Eat soft foods that are easy to digest.  Take over-the-counter and prescription medicines only as told by your health care provider.  Keep all follow-up visits as told by your health care provider. This is important. Contact a health care provider if:  You have blood in your stool 2-3 days after the procedure. Get help right away if you have:  More than a small spotting of blood in your stool.  Large blood clots in your stool.  Swelling of your abdomen.  Nausea or vomiting.  A fever.  Increasing pain in your abdomen that is not relieved with medicine. Summary  After the  procedure, it is common to have a small amount of blood in your stool. You may also have mild cramping and bloating of your abdomen.  For the first 24 hours after the procedure, do not drive or use machinery, sign important documents, or drink alcohol.  Get help right away if you have a lot of blood in your stool, nausea or vomiting, a fever, or increased pain in your abdomen. This information is not intended to replace advice given to you by your health care provider. Make sure you discuss any questions you have with your health care provider. Document Revised: 06/09/2019 Document Reviewed: 06/09/2019 Elsevier Patient Education  [place].

## 2020-07-09 LAB — SURGICAL PATHOLOGY

## 2020-07-14 ENCOUNTER — Encounter (INDEPENDENT_AMBULATORY_CARE_PROVIDER_SITE_OTHER): Payer: Self-pay | Admitting: *Deleted

## 2020-07-14 NOTE — Telephone Encounter (Signed)
This encounter was created in error - please disregard.

## 2020-07-22 ENCOUNTER — Encounter (HOSPITAL_COMMUNITY): Payer: Self-pay | Admitting: Internal Medicine

## 2020-10-18 ENCOUNTER — Encounter (INDEPENDENT_AMBULATORY_CARE_PROVIDER_SITE_OTHER): Payer: Self-pay

## 2020-10-18 ENCOUNTER — Ambulatory Visit (INDEPENDENT_AMBULATORY_CARE_PROVIDER_SITE_OTHER): Payer: Medicare Other | Admitting: Nurse Practitioner

## 2020-10-25 ENCOUNTER — Encounter (INDEPENDENT_AMBULATORY_CARE_PROVIDER_SITE_OTHER): Payer: Self-pay | Admitting: Nurse Practitioner

## 2020-10-25 ENCOUNTER — Telehealth (INDEPENDENT_AMBULATORY_CARE_PROVIDER_SITE_OTHER): Payer: Self-pay | Admitting: Nurse Practitioner

## 2020-10-25 ENCOUNTER — Other Ambulatory Visit: Payer: Self-pay

## 2020-10-25 ENCOUNTER — Ambulatory Visit (INDEPENDENT_AMBULATORY_CARE_PROVIDER_SITE_OTHER): Payer: Medicare Other | Admitting: Nurse Practitioner

## 2020-10-25 VITALS — BP 122/68 | HR 64 | Temp 98.1°F | Ht 73.0 in | Wt 185.2 lb

## 2020-10-25 DIAGNOSIS — E782 Mixed hyperlipidemia: Secondary | ICD-10-CM

## 2020-10-25 DIAGNOSIS — R011 Cardiac murmur, unspecified: Secondary | ICD-10-CM

## 2020-10-25 DIAGNOSIS — E119 Type 2 diabetes mellitus without complications: Secondary | ICD-10-CM | POA: Diagnosis not present

## 2020-10-25 DIAGNOSIS — E559 Vitamin D deficiency, unspecified: Secondary | ICD-10-CM | POA: Diagnosis not present

## 2020-10-25 DIAGNOSIS — Z23 Encounter for immunization: Secondary | ICD-10-CM

## 2020-10-25 DIAGNOSIS — I10 Essential (primary) hypertension: Secondary | ICD-10-CM | POA: Diagnosis not present

## 2020-10-25 NOTE — Progress Notes (Signed)
Subjective:  Patient ID: Adam Erickson, male    DOB: 05/24/1941  Age: 79 y.o. MRN: [id number]  CC:  Chief Complaint  Patient presents with  . Follow-up    patient states that he would like his vitamin D checked  . Diabetes  . Hypertension  . Hyperlipidemia  . Other      HPI  This patient arrives today for the above.  Diabetes: Last A1c was collected approximately 4 months ago and was 7.2.  This is a reduction from over 10.  He continues on Metformin twice a day and participating in intermittent fasting.  He is tolerating this well.  He is on ACE inhibitor and on statin.  Hypertension: He continues on lisinopril and is tolerating this well.  Hyperlipidemia: He continues on simvastatin and is due for lipid panel be checked.  Vitamin D deficiency: Last serum check was collected about 9 months ago and it was 19.  Since then he has started a supplement of 5000 IUs of vitamin D3 daily.  He is tolerating this well and is due to have serum level checked.  Past Medical History:  Diagnosis Date  . Diabetes mellitus without complication ([place]) 9/67/5916  . Essential hypertension, benign 02/12/2020  . HLD (hyperlipidemia) 02/12/2020      Family History  Problem Relation Age of Onset  . Heart disease Mother   . Cancer Father   . Dementia Sister   . Cancer Brother   . Stroke Brother     Social History   Social History Narrative   Divorced in [place].Lives alone.Retired.High School Psychologist, prison and probation services.   Social History   Tobacco Use  . Smoking status: Former Smoker    Types: Cigars  . Smokeless tobacco: Never Used  Substance Use Topics  . Alcohol use: Not Currently     Current Meds  Medication Sig  . Cholecalciferol (VITAMIN D3) 125 MCG (5000 UT) TABS Take 1 tablet by mouth daily.  Marland Kitchen lisinopril (ZESTRIL) 20 MG tablet Take 1 tablet (20 mg total) by mouth daily.  . metFORMIN (GLUCOPHAGE) 500 MG tablet Take 1 tablet (500 mg total) by mouth 2 (two) times daily with a meal.   . simvastatin (ZOCOR) 40 MG tablet Take 1 tablet (40 mg total) by mouth daily.    ROS:  Review of Systems  Constitutional: Negative for malaise/fatigue.  Eyes: Negative.   Respiratory: Negative for shortness of breath.   Cardiovascular: Negative for chest pain.     Objective:   Today's Vitals: BP 122/68   Pulse 64   Temp 98.1 F (36.7 C) (Temporal)   Ht 6' 1"  (1.854 m)   Wt 185 lb 3.2 oz (84 kg)   SpO2 94%   BMI 24.43 kg/m  Vitals with BMI 10/25/2020 07/08/2020 07/08/2020  Height 6' 1"  - -  Weight 185 lbs 3 oz - -  BMI 38.46 - -  Systolic 659 935 -  Diastolic 68 59 -  Pulse 64 57 51     Physical Exam Vitals reviewed.  Constitutional:      Appearance: Normal appearance.  HENT:     Head: Normocephalic and atraumatic.  Cardiovascular:     Rate and Rhythm: Normal rate and regular rhythm.     Heart sounds: Murmur heard.   Pulmonary:     Effort: Pulmonary effort is normal.     Breath sounds: Normal breath sounds.  Musculoskeletal:     Cervical back: Neck supple.  Skin:    General: Skin  is warm and dry.  Neurological:     Mental Status: He is alert and oriented to person, place, and time.  Psychiatric:        Mood and Affect: Mood normal.        Behavior: Behavior normal.        Thought Content: Thought content normal.        Judgment: Judgment normal.          Assessment and Plan   1. Diabetes mellitus without complication ([place])   2. Essential hypertension, benign   3. Mixed hyperlipidemia   4. Vitamin D deficiency disease   5. Murmur      Plan: 1.  We will send him to have blood work collected including A0O, metabolic panel, and check urine for albuminuria.  For now continue on his current medications and changes may be made pending blood work results. 2.  Blood pressure well controlled on current regimen he will continue on his current medications as prescribed. 3.  He will continue on his simvastatin we will check lipid panel for further  evaluation. 4.  He will continue on his vitamin D3 supplement we will check serum level for further evaluation. 5.  Murmur noted on exam today, will send him for cardiac echocardiogram for further evaluation.     Tests ordered Orders Placed This Encounter  Procedures  . Vitamin D, 25-hydroxy  . CMP with eGFR([facility])  . Hemoglobin A1c  . Lipid Panel  . Microalbumin/Creatinine Ratio, Urine  . ECHOCARDIOGRAM COMPLETE      No orders of the defined types were placed in this encounter.   Patient to follow-up in 3 to 6 months or sooner as needed for follow-up as well as for annual Medicare wellness visit.  Ailene Ards, NP

## 2020-10-25 NOTE — Telephone Encounter (Signed)
Ordering echocardiogram for patient. Please make sure this is scheduled.

## 2020-10-25 NOTE — Addendum Note (Signed)
Addended by: Anibal Henderson on: 10/25/2020 02:47 PM   Modules accepted: Orders

## 2020-11-09 ENCOUNTER — Telehealth (INDEPENDENT_AMBULATORY_CARE_PROVIDER_SITE_OTHER): Payer: Self-pay

## 2020-11-10 ENCOUNTER — Other Ambulatory Visit (INDEPENDENT_AMBULATORY_CARE_PROVIDER_SITE_OTHER): Payer: Medicare Other

## 2020-11-10 ENCOUNTER — Other Ambulatory Visit: Payer: Self-pay

## 2020-11-10 ENCOUNTER — Telehealth (INDEPENDENT_AMBULATORY_CARE_PROVIDER_SITE_OTHER): Payer: Self-pay

## 2020-11-11 ENCOUNTER — Telehealth (INDEPENDENT_AMBULATORY_CARE_PROVIDER_SITE_OTHER): Payer: Self-pay

## 2020-11-11 ENCOUNTER — Other Ambulatory Visit (INDEPENDENT_AMBULATORY_CARE_PROVIDER_SITE_OTHER): Payer: Self-pay | Admitting: Internal Medicine

## 2020-11-11 LAB — HEMOGLOBIN A1C
Hgb A1c MFr Bld: 7.3 % of total Hgb — ABNORMAL HIGH (ref <5.7)
Mean Plasma Glucose: 163 mg/dL
eAG (mmol/L): 9 mmol/L

## 2020-11-11 LAB — COMPLETE METABOLIC PANEL WITH GFR
AG Ratio: 1.6 (calc) (ref 1.0–2.5)
ALT: 13 U/L (ref 9–46)
AST: 17 U/L (ref 10–35)
Albumin: 4.3 g/dL (ref 3.6–5.1)
Alkaline phosphatase (APISO): 93 U/L (ref 35–144)
BUN: 15 mg/dL (ref 7–25)
CO2: 33 mmol/L — ABNORMAL HIGH (ref 20–32)
Calcium: 10 mg/dL (ref 8.6–10.3)
Chloride: 102 mmol/L (ref 98–110)
Creat: 0.9 mg/dL (ref 0.70–1.18)
GFR, Est African American: 94 mL/min/{1.73_m2} (ref >=60)
GFR, Est Non African American: 81 mL/min/{1.73_m2} (ref >=60)
Globulin: 2.7 g/dL (calc) (ref 1.9–3.7)
Glucose, Bld: 144 mg/dL — ABNORMAL HIGH (ref 65–139)
Potassium: 4.2 mmol/L (ref 3.5–5.3)
Sodium: 142 mmol/L (ref 135–146)
Total Bilirubin: 0.5 mg/dL (ref 0.2–1.2)
Total Protein: 7 g/dL (ref 6.1–8.1)

## 2020-11-11 LAB — VITAMIN D 25 HYDROXY (VIT D DEFICIENCY, FRACTURES): Vit D, 25-Hydroxy: 38 ng/mL (ref 30–100)

## 2020-11-11 LAB — LIPID PANEL
Cholesterol: 137 mg/dL (ref <200)
HDL: 39 mg/dL — ABNORMAL LOW (ref >=40)
LDL Cholesterol (Calc): 61 mg/dL (calc)
Non-HDL Cholesterol (Calc): 98 mg/dL (calc) (ref <130)
Total CHOL/HDL Ratio: 3.5 (calc) (ref <5.0)
Triglycerides: 352 mg/dL — ABNORMAL HIGH (ref <150)

## 2020-11-11 MED ORDER — LISINOPRIL 20 MG PO TABS: 20.0000 mg | ORAL_TABLET | Freq: Every day | ORAL | 1 refills | Status: DC

## 2020-11-11 MED ORDER — METFORMIN HCL 500 MG PO TABS: 500.0000 mg | ORAL_TABLET | Freq: Two times a day (BID) | ORAL | 1 refills | Status: DC

## 2020-11-11 MED ORDER — SIMVASTATIN 40 MG PO TABS: 40.0000 mg | ORAL_TABLET | Freq: Every day | ORAL | 1 refills | Status: DC

## 2020-11-11 NOTE — Telephone Encounter (Signed)
Pt will be going to heart care for the murmur concerns. He said he will do this instead of coming next week to see the NP for a acute visit. Being he lives so far away.

## 2020-11-11 NOTE — Telephone Encounter (Signed)
Called patient to give him lab results and he requested refills for the following medications for [facility]:   lisinopril (ZESTRIL) 20 MG tablet Last filled 06/09/2020, # 90 with 1 refill  metFORMIN (GLUCOPHAGE) 500 MG tablet  Last filled 06/09/2020, # 180 with 1 refill  simvastatin (ZOCOR) 40 MG tablet  Last filled 06/09/2020, # 90 with 1 refill  Last OV 10/25/2020

## 2020-11-17 ENCOUNTER — Ambulatory Visit (INDEPENDENT_AMBULATORY_CARE_PROVIDER_SITE_OTHER): Payer: Medicare Other | Admitting: Nurse Practitioner

## 2020-11-30 ENCOUNTER — Encounter (INDEPENDENT_AMBULATORY_CARE_PROVIDER_SITE_OTHER): Payer: Self-pay

## 2020-12-14 ENCOUNTER — Ambulatory Visit (HOSPITAL_COMMUNITY): Payer: Medicare Other

## 2020-12-21 ENCOUNTER — Other Ambulatory Visit: Payer: Self-pay

## 2020-12-21 ENCOUNTER — Telehealth (INDEPENDENT_AMBULATORY_CARE_PROVIDER_SITE_OTHER): Payer: Self-pay

## 2020-12-21 ENCOUNTER — Encounter (HOSPITAL_COMMUNITY): Payer: Self-pay

## 2020-12-21 DIAGNOSIS — K5900 Constipation, unspecified: Secondary | ICD-10-CM | POA: Diagnosis not present

## 2020-12-21 DIAGNOSIS — R10815 Periumbilic abdominal tenderness: Secondary | ICD-10-CM | POA: Insufficient documentation

## 2020-12-21 DIAGNOSIS — Z87891 Personal history of nicotine dependence: Secondary | ICD-10-CM | POA: Insufficient documentation

## 2020-12-21 DIAGNOSIS — Z7984 Long term (current) use of oral hypoglycemic drugs: Secondary | ICD-10-CM | POA: Diagnosis not present

## 2020-12-21 DIAGNOSIS — I10 Essential (primary) hypertension: Secondary | ICD-10-CM | POA: Insufficient documentation

## 2020-12-21 DIAGNOSIS — Z79899 Other long term (current) drug therapy: Secondary | ICD-10-CM | POA: Diagnosis not present

## 2020-12-21 DIAGNOSIS — E119 Type 2 diabetes mellitus without complications: Secondary | ICD-10-CM | POA: Insufficient documentation

## 2020-12-21 LAB — CBC
HCT: 43.4 % (ref 39.0–52.0)
Hemoglobin: 13.7 g/dL (ref 13.0–17.0)
MCH: 28.8 pg (ref 26.0–34.0)
MCHC: 31.6 g/dL (ref 30.0–36.0)
MCV: 91.2 fL (ref 80.0–100.0)
Platelets: 400 10*3/uL (ref 150–400)
RBC: 4.76 MIL/uL (ref 4.22–5.81)
RDW: 12.6 % (ref 11.5–15.5)
WBC: 10.5 10*3/uL (ref 4.0–10.5)
nRBC: 0 % (ref 0.0–0.2)

## 2020-12-21 LAB — COMPREHENSIVE METABOLIC PANEL
ALT: 16 U/L (ref 0–44)
AST: 18 U/L (ref 15–41)
Albumin: 3.9 g/dL (ref 3.5–5.0)
Alkaline Phosphatase: 102 U/L (ref 38–126)
Anion gap: 10 (ref 5–15)
BUN: 27 mg/dL — ABNORMAL HIGH (ref 8–23)
CO2: 27 mmol/L (ref 22–32)
Calcium: 9.7 mg/dL (ref 8.9–10.3)
Chloride: 98 mmol/L (ref 98–111)
Creatinine, Ser: 1.09 mg/dL (ref 0.61–1.24)
GFR, Estimated: >60 mL/min (ref >60)
Glucose, Bld: 166 mg/dL — ABNORMAL HIGH (ref 70–99)
Potassium: 4.3 mmol/L (ref 3.5–5.1)
Sodium: 135 mmol/L (ref 135–145)
Total Bilirubin: 1 mg/dL (ref 0.3–1.2)
Total Protein: 7.4 g/dL (ref 6.5–8.1)

## 2020-12-21 LAB — LIPASE, BLOOD: Lipase: 21 U/L (ref 11–51)

## 2020-12-21 NOTE — Telephone Encounter (Signed)
Received a call from the patient and he stated that he has not been able to keep food down and vomits when he tries to eat anything. Patient had not had a bowl movement in a long time and stated that he took a laxative on Sunday and had a bowel movement and has not had one since. Patient stated that this has been going on for 2 weeks and getting worse and he is getting very weak.  I advised for patient to go to ER ASAP and do not wait as patient had concerns for going. After discussion the patient verbalized an understanding and stated that he was going to [facility].  Sending as Adam Erickson.

## 2020-12-21 NOTE — Telephone Encounter (Signed)
I agree with the medical advice.  Thanks.

## 2020-12-21 NOTE — ED Triage Notes (Addendum)
Pt reports abdominal pain for 2 weeks. Reports he vomits daily usually at night or early morning. Pt reports BM on Sunday which didn't help with discomfort. Reports decrease appetite

## 2020-12-22 ENCOUNTER — Emergency Department (HOSPITAL_COMMUNITY): Payer: Medicare Other

## 2020-12-22 ENCOUNTER — Emergency Department (HOSPITAL_COMMUNITY)
Admission: EM | Admit: 2020-12-22 | Discharge: 2020-12-22 | Disposition: A | Discharge status: Home / Self Care | Payer: Medicare Other | Attending: Emergency Medicine | Admitting: Emergency Medicine

## 2020-12-22 DIAGNOSIS — K5900 Constipation, unspecified: Secondary | ICD-10-CM

## 2020-12-22 DIAGNOSIS — R1033 Periumbilical pain: Secondary | ICD-10-CM

## 2020-12-22 LAB — URINALYSIS, ROUTINE W REFLEX MICROSCOPIC
Bilirubin Urine: NEGATIVE
Glucose, UA: 50 mg/dL — AB
Hgb urine dipstick: NEGATIVE
Ketones, ur: 20 mg/dL — AB
Leukocytes,Ua: NEGATIVE
Nitrite: NEGATIVE
Protein, ur: NEGATIVE mg/dL
Specific Gravity, Urine: 1.015 (ref 1.005–1.030)
pH: 5 (ref 5.0–8.0)

## 2020-12-22 MED ORDER — SODIUM CHLORIDE 0.9 % IV BOLUS
500.0000 mL | Freq: Once | INTRAVENOUS | Status: AC
  Administered 2020-12-22: 500 mL via INTRAVENOUS

## 2020-12-22 MED ORDER — IOHEXOL 300 MG/ML  SOLN
100.0000 mL | Freq: Once | INTRAMUSCULAR | Status: AC | PRN
  Administered 2020-12-22: 100 mL via INTRAVENOUS

## 2020-12-22 MED ORDER — ONDANSETRON HCL 4 MG PO TABS: 4.0000 mg | ORAL_TABLET | Freq: Three times a day (TID) | ORAL | 0 refills | Status: DC | PRN

## 2020-12-22 NOTE — ED Provider Notes (Signed)
[facility] EMERGENCY DEPARTMENT Provider Note   CSN: [id number] Arrival date & time: 12/21/20  1447   Time seen 12:30 AM  History Chief Complaint  Patient presents with  . Abdominal Pain    Adam Erickson is a 80 y.o. male.  HPI Patient reports he has had periumbilical abdominal pain for couple weeks.  He states the pain comes and goes and he describes as a discomfort.  He states it lasts less than an hour.  However he states the pain he is having during my exam started about 3 hours ago.  He states he is vomiting about once a day either late at night or early in the morning when he gets up to eat.  He denies diarrhea.  He states he was constipated and he took a laxative and had a bowel movement on January 23 however that did not help with the discomfort.  He states his vomitus has a very strong acid taste to it but denies getting acid reflux in between episodes of vomiting.  He denies fever or any urinary difficulty.  He denies any abdominal bloating.  He states he has been burping a lot.  He denies chest pain.  He states he has never had this before.  He has never had any abdominal surgery before.  He denies any change in his diet.  Patient states he has had a colonoscopy in the past showing some polyps.  This was done by Dr. Laural Golden, gastroenterologist  PCP Doree Albee, MD     Past Medical History:  Diagnosis Date  . Diabetes mellitus without complication ([place]) [id number]  . Essential hypertension, benign 02/12/2020  . HLD (hyperlipidemia) 02/12/2020    Patient Active Problem List   Diagnosis Date Noted  . Essential hypertension, benign 02/12/2020  . Diabetes mellitus without complication ([place]) [id number]  . HLD (hyperlipidemia) 02/12/2020    Past Surgical History:  Procedure Laterality Date  . COLONOSCOPY N/A 07/08/2020   Procedure: COLONOSCOPY;  Surgeon: Rogene Houston, MD;  Location: [facility];  Service: Endoscopy;  Laterality: N/A;  925  . POLYPECTOMY   07/08/2020   Procedure: POLYPECTOMY;  Surgeon: Rogene Houston, MD;  Location: [facility];  Service: Endoscopy;;       Family History  Problem Relation Age of Onset  . Heart disease Mother   . Cancer Father   . Dementia Sister   . Cancer Brother   . Stroke Brother     Social History   Tobacco Use  . Smoking status: Former Smoker    Types: Cigars  . Smokeless tobacco: Never Used  Vaping Use  . Vaping Use: Never used  Substance Use Topics  . Alcohol use: Not Currently  lives at home Lives alone  Home Medications Prior to Admission medications   Medication Sig Start Date End Date Taking? Authorizing Provider  ondansetron (ZOFRAN) 4 MG tablet Take 1 tablet (4 mg total) by mouth every 8 (eight) hours as needed. 12/22/20  Yes Rolland Porter, MD  Cholecalciferol (VITAMIN D3) 125 MCG (5000 UT) TABS Take 1 tablet by mouth daily.    [provider]  lisinopril (ZESTRIL) 20 MG tablet Take 1 tablet (20 mg total) by mouth daily. 11/11/20   Doree Albee, MD  metFORMIN (GLUCOPHAGE) 500 MG tablet Take 1 tablet (500 mg total) by mouth 2 (two) times daily with a meal. 11/11/20   Gosrani, Nimish C, MD  simvastatin (ZOCOR) 40 MG tablet Take 1 tablet (40 mg total)  by mouth daily. 11/11/20   Doree Albee, MD    Allergies    Patient has no known allergies.  Review of Systems   Review of Systems  All other systems reviewed and are negative.   Physical Exam Updated Vital Signs BP 128/66   Pulse 80   Temp 98.3 F (36.8 C) (Oral)   Resp 18   Ht 6' (1.829 m)   Wt 83.9 kg   SpO2 100%   BMI 25.09 kg/m   Physical Exam Vitals and nursing note reviewed.  Constitutional:      General: He is not in acute distress.    Appearance: Normal appearance. He is normal weight. He is not ill-appearing or toxic-appearing.  HENT:     Head: Normocephalic and atraumatic.     Right Ear: External ear normal.     Left Ear: External ear normal.     Mouth/Throat:     Mouth: Mucous  membranes are dry.  Eyes:     Extraocular Movements: Extraocular movements intact.     Conjunctiva/sclera: Conjunctivae normal.     Pupils: Pupils are equal, round, and reactive to light.  Cardiovascular:     Rate and Rhythm: Normal rate and regular rhythm.     Heart sounds: Murmur heard.    Pulmonary:     Effort: Pulmonary effort is normal. No respiratory distress.     Breath sounds: Normal breath sounds.  Abdominal:     General: Bowel sounds are normal. There is no distension.     Palpations: Abdomen is soft.     Tenderness: There is no abdominal tenderness. There is no guarding or rebound.  Musculoskeletal:        General: Normal range of motion.     Cervical back: Normal range of motion.  Skin:    General: Skin is warm and dry.  Neurological:     General: No focal deficit present.     Mental Status: He is alert and oriented to person, place, and time.     Cranial Nerves: No cranial nerve deficit.  Psychiatric:        Mood and Affect: Mood normal.        Behavior: Behavior normal.        Thought Content: Thought content normal.     ED Results / Procedures / Treatments   Labs (all labs ordered are listed, but only abnormal results are displayed) Results for orders placed or performed during the hospital encounter of 12/22/20  Lipase, blood  Result Value Ref Range   Lipase 21 11 - 51 U/L  Comprehensive metabolic panel  Result Value Ref Range   Sodium 135 135 - 145 mmol/L   Potassium 4.3 3.5 - 5.1 mmol/L   Chloride 98 98 - 111 mmol/L   CO2 27 22 - 32 mmol/L   Glucose, Bld 166 (H) 70 - 99 mg/dL   BUN 27 (H) 8 - 23 mg/dL   Creatinine, Ser 1.09 0.61 - 1.24 mg/dL   Calcium 9.7 8.9 - 10.3 mg/dL   Total Protein 7.4 6.5 - 8.1 g/dL   Albumin 3.9 3.5 - 5.0 g/dL   AST 18 15 - 41 U/L   ALT 16 0 - 44 U/L   Alkaline Phosphatase 102 38 - 126 U/L   Total Bilirubin 1.0 0.3 - 1.2 mg/dL   GFR, Estimated >60 >60 mL/min   Anion gap 10 5 - 15  CBC  Result Value Ref Range   WBC  10.5 4.0 -  10.5 K/uL   RBC 4.76 4.22 - 5.81 MIL/uL   Hemoglobin 13.7 13.0 - 17.0 g/dL   HCT 43.4 39.0 - 52.0 %   MCV 91.2 80.0 - 100.0 fL   MCH 28.8 26.0 - 34.0 pg   MCHC 31.6 30.0 - 36.0 g/dL   RDW 12.6 11.5 - 15.5 %   Platelets 400 150 - 400 K/uL   nRBC 0.0 0.0 - 0.2 %  Urinalysis, Routine w reflex microscopic Urine, Clean Catch  Result Value Ref Range   Color, Urine YELLOW YELLOW   APPearance CLEAR CLEAR   Specific Gravity, Urine 1.015 1.005 - 1.030   pH 5.0 5.0 - 8.0   Glucose, UA 50 (A) NEGATIVE mg/dL   Hgb urine dipstick NEGATIVE NEGATIVE   Bilirubin Urine NEGATIVE NEGATIVE   Ketones, ur 20 (A) NEGATIVE mg/dL   Protein, ur NEGATIVE NEGATIVE mg/dL   Nitrite NEGATIVE NEGATIVE   Leukocytes,Ua NEGATIVE NEGATIVE   Laboratory interpretation all normal    EKG None  Radiology CT Abdomen Pelvis W Contrast  Result Date: 12/22/2020 CLINICAL DATA:  Acute, nonlocalized abdominal pain, repetitive vomiting. EXAM: CT ABDOMEN AND PELVIS WITH CONTRAST TECHNIQUE: Multidetector CT imaging of the abdomen and pelvis was performed using the standard protocol following bolus administration of intravenous contrast. CONTRAST:  180mL OMNIPAQUE IOHEXOL 300 MG/ML  SOLN COMPARISON:  None FINDINGS: Lower chest: The visualized lung bases are clear bilaterally. Extensive calcification of the aortic valve leaflets are identified. Moderate calcification of the mitral valve annulus. Mild coronary artery calcification. Global cardiac size within normal limits. Small hiatal hernia Hepatobiliary: No focal liver abnormality is seen. No gallstones, gallbladder wall thickening, or biliary dilatation. Pancreas: Unremarkable Spleen: Unremarkable Adrenals/Urinary Tract: The adrenal glands are unremarkable. The kidneys are normal in size and position. Multiple simple cortical cysts are seen within the kidneys bilaterally. The kidneys are otherwise unremarkable. The bladder is unremarkable. Stomach/Bowel: Single loop of  unremarkable small bowel seen within a moderate right inguinal hernia. Stomach, small bowel, and appendix are otherwise unremarkable. Moderate stool seen throughout the colon without evidence of obstruction. The colon is otherwise unremarkable. No evidence of focal inflammation. No free intraperitoneal gas or fluid. Vascular/Lymphatic: Moderate aortoiliac atherosclerotic calcification. No aortic aneurysm. No pathologic adenopathy within the abdomen and pelvis. Reproductive: The prostate gland is moderately enlarged. Seminal vesicles are unremarkable. Other: Moderate fat containing left inguinal hernia. The rectum is unremarkable. Musculoskeletal: Remote appearing L2 compression fracture with approximately 60-70% loss of height. No retropulsion. No acute bone abnormality. IMPRESSION: No acute intra-abdominal pathology identified. No definite radiographic explanation for the patient's reported symptoms. Extensive calcification of the aortic valve leaflets. Echocardiography may be helpful to assess for valvular dysfunction. Moderate stool throughout the colon without evidence of obstruction. Peripheral vascular disease. Bilateral inguinal hernias. Single loop of unremarkable small bowel within the right inguinal hernia. Remote appearing L2 compression fracture with approximately 60-70% loss of height. Aortic Atherosclerosis (ICD10-I70.0). Electronically Signed   By: Fidela Salisbury MD   On: 12/22/2020 02:07    Procedures Procedures   Medications Ordered in ED Medications  iohexol (OMNIPAQUE) 300 MG/ML solution 100 mL (100 mLs Intravenous Contrast Given 12/22/20 0140)  sodium chloride 0.9 % bolus 500 mL (0 mLs Intravenous Stopped 12/22/20 0327)    ED Course  I have reviewed the triage vital signs and the nursing notes.  Pertinent labs & imaging results that were available during my care of the patient were reviewed by me and considered in my medical decision making (see chart  for details).    MDM  Rules/Calculators/A&P                          Patient's labs were normal, his urinalysis is pending.  He is agreeable to getting a CT scan.  Recheck at 3:55 AM patient is sleeping soundly.  I had to say his name and shake his foot to wake him up.  We discussed his CT results.  His urinalysis is in the lab and should be resulted shortly and then he can be discharged home.  I suspect he basically has constipation and sometimes has some loose stool that goes around it and he is thinking he is having diarrhea.  When I review his CT scan he does have a lot of stool throughout the colon.  He will be advised on what to do is for a laxative and hopefully that will resolve his current problem.  Final Clinical Impression(s) / ED Diagnoses Final diagnoses:  Periumbilical abdominal pain  Constipation, unspecified constipation type    Rx / DC Orders ED Discharge Orders         Ordered    ondansetron (ZOFRAN) 4 MG tablet  Every 8 hours PRN        12/22/20 0414        OTC miralax  Plan discharge  Rolland Porter, MD, Barbette Or, MD 12/22/20 [phone]

## 2020-12-22 NOTE — Discharge Instructions (Addendum)
Get miralax and put one dose or 17 g in 8 ounces of water,  take 1 dose every 30 minutes for 2-3 hours or until you  get good results and then once or twice daily to prevent constipation.  Use the Zofran for nausea or vomiting.  Please follow-up with your family doctor if you continue to have problems.

## 2020-12-23 ENCOUNTER — Other Ambulatory Visit: Payer: Self-pay

## 2020-12-23 ENCOUNTER — Ambulatory Visit (HOSPITAL_COMMUNITY)
Admission: RE | Admit: 2020-12-23 | Discharge: 2020-12-23 | Disposition: A | Discharge status: Home / Self Care | Payer: Medicare Other | Source: Ambulatory Visit | Attending: Nurse Practitioner | Admitting: Nurse Practitioner

## 2020-12-23 ENCOUNTER — Telehealth: Payer: Self-pay

## 2020-12-23 DIAGNOSIS — R011 Cardiac murmur, unspecified: Secondary | ICD-10-CM | POA: Diagnosis not present

## 2020-12-23 LAB — ECHOCARDIOGRAM COMPLETE
AR max vel: 0.78 cm2
AV Area VTI: 0.87 cm2
AV Area mean vel: 0.91 cm2
AV Mean grad: 22.3 mmHg
AV Peak grad: 42.6 mmHg
Ao pk vel: 3.26 m/s
Area-P 1/2: 1.97 cm2
S' Lateral: 2.1 cm

## 2020-12-23 NOTE — Telephone Encounter (Signed)
Transition Care Management Follow-up Telephone Call  Date of discharge and from where: 12/22/2020 from [facility]  How have you been since you were released from the hospital? Pt is still having abdominal discomfort. He was not able to pick up the rx from [facility] and requested that the rx gets sent to [facility]. Patient hopes that he can get it fill today.   Any questions or concerns? No  Items Reviewed:  Did the pt receive and understand the discharge instructions provided? Yes   Medications obtained and verified? Yes   Other? No   Any new allergies since your discharge? No   Dietary orders reviewed? Yes  Do you have support at home? Yes    Functional Questionnaire: (I = Independent and D = Dependent) ADLs: I  Bathing/Dressing- I  Meal Prep- I  Eating- I  Maintaining continence- I  Transferring/Ambulation- I  Managing Meds- I  Follow up appointments reviewed:   PCP Hospital f/u appt confirmed? No  Patient will call PCP to make a sooner appointment than March.   [facility] f/u appt confirmed? No    Are transportation arrangements needed? No   If their condition worsens, is the pt aware to call PCP or go to the Emergency Dept.? Yes  Was the patient provided with contact information for the PCP's office or ED? Yes  Was to pt encouraged to call back with questions or concerns? Yes

## 2020-12-23 NOTE — Progress Notes (Signed)
*  PRELIMINARY RESULTS* Echocardiogram 2D Echocardiogram has been performed.  Samuel Germany 12/23/2020, 1:45 PM

## 2020-12-24 ENCOUNTER — Other Ambulatory Visit (INDEPENDENT_AMBULATORY_CARE_PROVIDER_SITE_OTHER): Payer: Self-pay | Admitting: Nurse Practitioner

## 2020-12-24 DIAGNOSIS — I351 Nonrheumatic aortic (valve) insufficiency: Secondary | ICD-10-CM

## 2020-12-27 NOTE — Progress Notes (Signed)
Referral has been placed. Thank you.

## 2021-01-12 ENCOUNTER — Emergency Department (HOSPITAL_COMMUNITY): Payer: Medicare Other

## 2021-01-12 ENCOUNTER — Telehealth (INDEPENDENT_AMBULATORY_CARE_PROVIDER_SITE_OTHER): Payer: Medicare Other | Admitting: Nurse Practitioner

## 2021-01-12 ENCOUNTER — Other Ambulatory Visit: Payer: Self-pay

## 2021-01-12 ENCOUNTER — Encounter (HOSPITAL_COMMUNITY): Payer: Self-pay

## 2021-01-12 ENCOUNTER — Emergency Department (HOSPITAL_COMMUNITY)
Admission: EM | Admit: 2021-01-12 | Discharge: 2021-01-12 | Disposition: A | Discharge status: Home / Self Care | Payer: Medicare Other | Attending: Emergency Medicine | Admitting: Emergency Medicine

## 2021-01-12 DIAGNOSIS — K5909 Other constipation: Secondary | ICD-10-CM

## 2021-01-12 DIAGNOSIS — Z7984 Long term (current) use of oral hypoglycemic drugs: Secondary | ICD-10-CM | POA: Insufficient documentation

## 2021-01-12 DIAGNOSIS — E119 Type 2 diabetes mellitus without complications: Secondary | ICD-10-CM | POA: Diagnosis not present

## 2021-01-12 DIAGNOSIS — Z79899 Other long term (current) drug therapy: Secondary | ICD-10-CM | POA: Insufficient documentation

## 2021-01-12 DIAGNOSIS — R739 Hyperglycemia, unspecified: Secondary | ICD-10-CM

## 2021-01-12 DIAGNOSIS — R1084 Generalized abdominal pain: Secondary | ICD-10-CM | POA: Insufficient documentation

## 2021-01-12 DIAGNOSIS — R112 Nausea with vomiting, unspecified: Secondary | ICD-10-CM | POA: Insufficient documentation

## 2021-01-12 DIAGNOSIS — R109 Unspecified abdominal pain: Secondary | ICD-10-CM

## 2021-01-12 DIAGNOSIS — I10 Essential (primary) hypertension: Secondary | ICD-10-CM | POA: Diagnosis not present

## 2021-01-12 DIAGNOSIS — Z87891 Personal history of nicotine dependence: Secondary | ICD-10-CM | POA: Insufficient documentation

## 2021-01-12 DIAGNOSIS — R030 Elevated blood-pressure reading, without diagnosis of hypertension: Secondary | ICD-10-CM

## 2021-01-12 LAB — COMPREHENSIVE METABOLIC PANEL
ALT: 17 U/L (ref 0–44)
AST: 18 U/L (ref 15–41)
Albumin: 2.8 g/dL — ABNORMAL LOW (ref 3.5–5.0)
Alkaline Phosphatase: 73 U/L (ref 38–126)
Anion gap: 7 (ref 5–15)
BUN: 17 mg/dL (ref 8–23)
CO2: 27 mmol/L (ref 22–32)
Calcium: 9 mg/dL (ref 8.9–10.3)
Chloride: 98 mmol/L (ref 98–111)
Creatinine, Ser: 0.81 mg/dL (ref 0.61–1.24)
GFR, Estimated: >60 mL/min (ref >60)
Glucose, Bld: 225 mg/dL — ABNORMAL HIGH (ref 70–99)
Potassium: 4.1 mmol/L (ref 3.5–5.1)
Sodium: 132 mmol/L — ABNORMAL LOW (ref 135–145)
Total Bilirubin: 0.7 mg/dL (ref 0.3–1.2)
Total Protein: 6.7 g/dL (ref 6.5–8.1)

## 2021-01-12 LAB — CBC
HCT: 35.7 % — ABNORMAL LOW (ref 39.0–52.0)
Hemoglobin: 11.4 g/dL — ABNORMAL LOW (ref 13.0–17.0)
MCH: 28.8 pg (ref 26.0–34.0)
MCHC: 31.9 g/dL (ref 30.0–36.0)
MCV: 90.2 fL (ref 80.0–100.0)
Platelets: 403 10*3/uL — ABNORMAL HIGH (ref 150–400)
RBC: 3.96 MIL/uL — ABNORMAL LOW (ref 4.22–5.81)
RDW: 12.2 % (ref 11.5–15.5)
WBC: 7.9 10*3/uL (ref 4.0–10.5)
nRBC: 0 % (ref 0.0–0.2)

## 2021-01-12 LAB — LIPASE, BLOOD: Lipase: 19 U/L (ref 11–51)

## 2021-01-12 MED ORDER — METOCLOPRAMIDE HCL 10 MG PO TABS: 10.0000 mg | ORAL_TABLET | Freq: Three times a day (TID) | ORAL | 0 refills | Status: DC | PRN

## 2021-01-12 MED ORDER — SODIUM CHLORIDE 0.9 % IV BOLUS
1000.0000 mL | Freq: Once | INTRAVENOUS | Status: AC
  Administered 2021-01-12: 1000 mL via INTRAVENOUS

## 2021-01-12 MED ORDER — IOHEXOL 350 MG/ML SOLN: 100.0000 mL | Freq: Once | INTRAVENOUS | Status: DC | PRN

## 2021-01-12 MED ORDER — ONDANSETRON HCL 4 MG/2ML IJ SOLN
4.0000 mg | Freq: Once | INTRAMUSCULAR | Status: AC
  Administered 2021-01-12: 4 mg via INTRAVENOUS
  Filled 2021-01-12: qty 2

## 2021-01-12 NOTE — ED Triage Notes (Signed)
Pt to er, pt states that he is here because he has been vomiting for the past three weeks, states that his pmd sent him to the er to get evaluated for a bowel obstructions.

## 2021-01-12 NOTE — Discharge Instructions (Addendum)
It was our pleasure to provide your ER care today - we hope that you feel better.  Rest. Drink plenty of fluids.   You may take reglan as need for nausea. If constipation, take colace (stool softener), and miralax (laxative) as need - these medications are available over the counter.   Follow up with your doctor tomorrow as planned. Have your blood pressure and blood sugar rechecked then, as high today.   Return to ER if worse, if you reconsider and wish to have additional testing/imaging done, worsening or severe abdominal pain, persistent vomiting, fevers, chest pain, or other concern.

## 2021-01-12 NOTE — ED Notes (Signed)
Pt refused his CT scan

## 2021-01-12 NOTE — Progress Notes (Signed)
An audio-only tele-health visit was conducted today. I connected with  Adam Erickson on 01/12/21 utilizing audio-only technology and verified that I am speaking with the correct person using two identifiers. The patient was located at their home, and I was located at the office of [facility] during the encounter. I discussed the limitations of evaluation and management by telemedicine. The patient expressed understanding and agreed to proceed.   Subjective:  Patient ID: Adam Erickson, male    DOB: [birth year]  Age: 80 y.o. MRN: [id number]  CC:  Chief Complaint  Patient presents with  . Abdominal Pain      HPI  This patient arrives today for virtual visit for the above.  He tells me he has been having abdominal pain with nausea and vomiting for the last 2+ weeks.  He tells me most the time he is not able to keep down food or liquids.  He will sometimes be able to keep down milk and creamy soups.  He will also go a couple of days at a time without any nausea or vomiting but then this will return.  He has been experiencing some abnormal pain as well.  He was seen in the emergency department back on 12/22/20 at which point CT scan was done and this showed moderate stool throughout the colon without evidence of obstruction as well as bilateral inguinal hernias, no other significant abnormality noted to explain his symptoms.  He tells me he had tried taking some famotidine over-the-counter in case his symptoms were related to heartburn but this did not help his symptoms. He can not recall his last bowel movement, but believes it was a few days ago, he reports needing to take a laxative to have a bowel movement. In addition, he does report he is able to pass some gas, but has been mostly burping.   Past Medical History:  Diagnosis Date  . Diabetes mellitus without complication ([place]) 7/82/9562  . Essential hypertension, benign 02/12/2020  . HLD (hyperlipidemia) 02/12/2020       Family History  Problem Relation Age of Onset  . Heart disease Mother   . Cancer Father   . Dementia Sister   . Cancer Brother   . Stroke Brother     Social History   Social History Narrative   Divorced in [place].Lives alone.Retired.High School Psychologist, prison and probation services.   Social History   Tobacco Use  . Smoking status: Former Smoker    Types: Cigars  . Smokeless tobacco: Never Used  Substance Use Topics  . Alcohol use: Not Currently     Current Meds  Medication Sig  . Cholecalciferol (VITAMIN D3) 125 MCG (5000 UT) TABS Take 1 tablet by mouth daily.  Marland Kitchen lisinopril (ZESTRIL) 20 MG tablet Take 1 tablet (20 mg total) by mouth daily.    ROS:  Review of Systems  Gastrointestinal: Positive for abdominal pain, nausea and vomiting.     Objective:   Today's Vitals: There were no vitals taken for this visit. Vitals with BMI 12/22/2020 12/22/2020 12/21/2020  Height - - -  Weight - - -  BMI - - -  Systolic 130 865 784  Diastolic 71 66 58  Pulse 81 80 74     Physical Exam Comprehensive physical exam not completed today as office visit was conducted remotely.  Patient sounded fairly well over the phone, but did express his significant discomfort..  Patient was alert and oriented, and appeared to have appropriate judgment.  Assessment and Plan   1. Nausea and vomiting, intractability of vomiting not specified, unspecified vomiting type      Plan: 1.  I discussed this case with Dr. Anastasio Champion and he and I have come to the conclusion that the patient needs to be seen emergently in the emergency department as he is unable to keep down food or water currently.  I am concerned of possible bowel obstruction.  He is agreeable to going to the emergency department tells me he will have that way now.  We will also order urgent referral to gastroenterology.   Tests ordered Orders Placed This Encounter  Procedures  . Ambulatory referral to Gastroenterology      No orders  of the defined types were placed in this encounter.   Patient to follow-up on Monday or sooner as needed.  Total time spent on the telephone today was 16 minutes.  Ailene Ards, NP

## 2021-01-12 NOTE — ED Notes (Signed)
Pt pulled out his IV and stated he is ready to leave. Pt signed out AMA. EDP made aware.

## 2021-01-12 NOTE — ED Notes (Signed)
Patient transported to CT 

## 2021-01-12 NOTE — ED Provider Notes (Signed)
[facility] EMERGENCY DEPARTMENT Provider Note   CSN: [id number] Arrival date & time: 01/12/21  1727     History Chief Complaint  Patient presents with  . Emesis    Adam Erickson is a 80 y.o. male.  Patient c/o recurrent nausea, vomiting, and general abd discomfort for the past 2-3 weeks. Symptoms gradual onset, moderate, constant, persistent/recurrent. Emesis yellowish, non bloody. No abd distension. Some constipation but states had 2 bms three days ago. Occasional passing gas. Denies fever/chills. No dysuria or gu c/o. +decreased po intake. No back/flank pain. Denies headache. No chest pain or discomfort. No cough or uri symptoms.    Emesis Associated symptoms: abdominal pain   Associated symptoms: no cough, no fever, no headaches and no sore throat        Past Medical History:  Diagnosis Date  . Diabetes mellitus without complication ([place]) 9/56/2130  . Essential hypertension, benign 02/12/2020  . HLD (hyperlipidemia) 02/12/2020    Patient Active Problem List   Diagnosis Date Noted  . Essential hypertension, benign 02/12/2020  . Diabetes mellitus without complication ([place]) 86/57/8469  . HLD (hyperlipidemia) 02/12/2020    Past Surgical History:  Procedure Laterality Date  . COLONOSCOPY N/A 07/08/2020   Procedure: COLONOSCOPY;  Surgeon: Rogene Houston, MD;  [facility];  Service: Endoscopy;  Laterality: N/A;  925  . POLYPECTOMY  07/08/2020   Procedure: POLYPECTOMY;  Surgeon: Rogene Houston, MD;  Location: [facility];  Service: Endoscopy;;       Family History  Problem Relation Age of Onset  . Heart disease Mother   . Cancer Father   . Dementia Sister   . Cancer Brother   . Stroke Brother     Social History   Tobacco Use  . Smoking status: Former Smoker    Types: Cigars  . Smokeless tobacco: Never Used  Vaping Use  . Vaping Use: Never used  Substance Use Topics  . Alcohol use: Not Currently  . Drug use: Never    Home  Medications Prior to Admission medications   Medication Sig Start Date End Date Taking? Authorizing Provider  Cholecalciferol (VITAMIN D3) 125 MCG (5000 UT) TABS Take 1 tablet by mouth daily.    [provider]  lisinopril (ZESTRIL) 20 MG tablet Take 1 tablet (20 mg total) by mouth daily. 11/11/20   Doree Albee, MD  metFORMIN (GLUCOPHAGE) 500 MG tablet Take 1 tablet (500 mg total) by mouth 2 (two) times daily with a meal. Patient taking differently: Take 500 mg by mouth daily with breakfast. 11/11/20   Hurshel Party C, MD  ondansetron (ZOFRAN) 4 MG tablet Take 1 tablet (4 mg total) by mouth every 8 (eight) hours as needed. Patient not taking: Reported on 01/12/2021 12/22/20   Rolland Porter, MD  simvastatin (ZOCOR) 40 MG tablet Take 1 tablet (40 mg total) by mouth daily. Patient not taking: Reported on 01/12/2021 11/11/20   Doree Albee, MD    Allergies    Patient has no known allergies.  Review of Systems   Review of Systems  Constitutional: Negative for fever.  HENT: Negative for sore throat.   Eyes: Negative for redness.  Respiratory: Negative for cough and shortness of breath.   Cardiovascular: Negative for chest pain.  Gastrointestinal: Positive for abdominal pain, nausea and vomiting.  Endocrine: Negative for polyuria.  Genitourinary: Negative for dysuria and flank pain.  Musculoskeletal: Negative for back pain and neck pain.  Skin: Negative for rash.  Neurological: Negative  for headaches.  Hematological: Does not bruise/bleed easily.  Psychiatric/Behavioral: Negative for confusion.    Physical Exam Updated Vital Signs BP (!) 141/74   Pulse 84   Temp 98.2 F (36.8 C) (Oral)   Resp 18   Ht 1.829 m (6')   Wt 72.6 kg   SpO2 100%   BMI 21.70 kg/m   Physical Exam Vitals and nursing note reviewed.  Constitutional:      Appearance: Normal appearance. He is well-developed.  HENT:     Head: Atraumatic.     Nose: Nose normal.     Mouth/Throat:      Mouth: Mucous membranes are moist.     Pharynx: Oropharynx is clear.  Eyes:     General: No scleral icterus.    Conjunctiva/sclera: Conjunctivae normal.  Neck:     Trachea: No tracheal deviation.  Cardiovascular:     Rate and Rhythm: Normal rate and regular rhythm.     Pulses: Normal pulses.     Heart sounds: Normal heart sounds. No murmur heard. No friction rub. No gallop.   Pulmonary:     Effort: Pulmonary effort is normal. No accessory muscle usage or respiratory distress.     Breath sounds: Normal breath sounds.  Abdominal:     General: Bowel sounds are normal. There is no distension.     Palpations: Abdomen is soft.     Tenderness: There is abdominal tenderness. There is no guarding.     Comments: Mid abd tenderness.   Genitourinary:    Comments: No cva tenderness. Musculoskeletal:        General: No swelling.     Cervical back: Normal range of motion and neck supple. No rigidity.  Skin:    General: Skin is warm and dry.     Findings: No rash.  Neurological:     Mental Status: He is alert.     Comments: Alert, speech clear.   Psychiatric:        Mood and Affect: Mood normal.     ED Results / Procedures / Treatments   Labs (all labs ordered are listed, but only abnormal results are displayed) Results for orders placed or performed during the hospital encounter of 01/12/21  CBC  Result Value Ref Range   WBC 7.9 4.0 - 10.5 K/uL   RBC 3.96 (L) 4.22 - 5.81 MIL/uL   Hemoglobin 11.4 (L) 13.0 - 17.0 g/dL   HCT 35.7 (L) 39.0 - 52.0 %   MCV 90.2 80.0 - 100.0 fL   MCH 28.8 26.0 - 34.0 pg   MCHC 31.9 30.0 - 36.0 g/dL   RDW 12.2 11.5 - 15.5 %   Platelets 403 (H) 150 - 400 K/uL   nRBC 0.0 0.0 - 0.2 %  Comprehensive metabolic panel  Result Value Ref Range   Sodium 132 (L) 135 - 145 mmol/L   Potassium 4.1 3.5 - 5.1 mmol/L   Chloride 98 98 - 111 mmol/L   CO2 27 22 - 32 mmol/L   Glucose, Bld 225 (H) 70 - 99 mg/dL   BUN 17 8 - 23 mg/dL   Creatinine, Ser 0.81 0.61 - 1.24  mg/dL   Calcium 9.0 8.9 - 10.3 mg/dL   Total Protein 6.7 6.5 - 8.1 g/dL   Albumin 2.8 (L) 3.5 - 5.0 g/dL   AST 18 15 - 41 U/L   ALT 17 0 - 44 U/L   Alkaline Phosphatase 73 38 - 126 U/L   Total Bilirubin 0.7 0.3 - 1.2  mg/dL   GFR, Estimated >60 >60 mL/min   Anion gap 7 5 - 15  Lipase, blood  Result Value Ref Range   Lipase 19 11 - 51 U/L   CT Abdomen Pelvis W Contrast  Result Date: 12/22/2020 CLINICAL DATA:  Acute, nonlocalized abdominal pain, repetitive vomiting. EXAM: CT ABDOMEN AND PELVIS WITH CONTRAST TECHNIQUE: Multidetector CT imaging of the abdomen and pelvis was performed using the standard protocol following bolus administration of intravenous contrast. CONTRAST:  132mL OMNIPAQUE IOHEXOL 300 MG/ML  SOLN COMPARISON:  None FINDINGS: Lower chest: The visualized lung bases are clear bilaterally. Extensive calcification of the aortic valve leaflets are identified. Moderate calcification of the mitral valve annulus. Mild coronary artery calcification. Global cardiac size within normal limits. Small hiatal hernia Hepatobiliary: No focal liver abnormality is seen. No gallstones, gallbladder wall thickening, or biliary dilatation. Pancreas: Unremarkable Spleen: Unremarkable Adrenals/Urinary Tract: The adrenal glands are unremarkable. The kidneys are normal in size and position. Multiple simple cortical cysts are seen within the kidneys bilaterally. The kidneys are otherwise unremarkable. The bladder is unremarkable. Stomach/Bowel: Single loop of unremarkable small bowel seen within a moderate right inguinal hernia. Stomach, small bowel, and appendix are otherwise unremarkable. Moderate stool seen throughout the colon without evidence of obstruction. The colon is otherwise unremarkable. No evidence of focal inflammation. No free intraperitoneal gas or fluid. Vascular/Lymphatic: Moderate aortoiliac atherosclerotic calcification. No aortic aneurysm. No pathologic adenopathy within the abdomen and pelvis.  Reproductive: The prostate gland is moderately enlarged. Seminal vesicles are unremarkable. Other: Moderate fat containing left inguinal hernia. The rectum is unremarkable. Musculoskeletal: Remote appearing L2 compression fracture with approximately 60-70% loss of height. No retropulsion. No acute bone abnormality. IMPRESSION: No acute intra-abdominal pathology identified. No definite radiographic explanation for the patient's reported symptoms. Extensive calcification of the aortic valve leaflets. Echocardiography may be helpful to assess for valvular dysfunction. Moderate stool throughout the colon without evidence of obstruction. Peripheral vascular disease. Bilateral inguinal hernias. Single loop of unremarkable small bowel within the right inguinal hernia. Remote appearing L2 compression fracture with approximately 60-70% loss of height. Aortic Atherosclerosis (ICD10-I70.0). Electronically Signed   By: Fidela Salisbury MD   On: 12/22/2020 02:07   ECHOCARDIOGRAM COMPLETE  Result Date: 12/23/2020    ECHOCARDIOGRAM REPORT   Patient Name:   KENDRE JACINTO Date of Exam: 12/23/2020 Medical Rec #:  [id number]      Height:       72.0 in Accession #:    [id number]     Weight:       185.0 lb Date of Birth:  1941/02/03      BSA:          2.061 m Patient Age:    50 years       BP:           116/68 mmHg Patient Gender: M              HR:           61 bpm. Exam Location:  [facility] Procedure: 2D Echo, Cardiac Doppler and Color Doppler Indications:    Murmur 785.2 / R01.1  History:        Patient has no prior history of Echocardiogram examinations.                 Risk Factors:Hypertension, Diabetes and Dyslipidemia.  Sonographer:    Alvino Chapel RCS Referring Phys: [id number] [place]  1. Left ventricular ejection fraction, by estimation, is 60 to  65%. The left ventricle has normal function. The left ventricle has no regional wall motion abnormalities. There is mild left ventricular hypertrophy. Left  ventricular diastolic parameters are consistent with Grade I diastolic dysfunction (impaired relaxation).  2. Right ventricular systolic function is normal. The right ventricular size is normal.  3. Left atrial size was mildly dilated.  4. The mitral valve is normal in structure. No evidence of mitral valve regurgitation. No evidence of mitral stenosis.  5. The aortic valve has an indeterminant number of cusps. There is severe calcifcation of the aortic valve. There is severe thickening of the aortic valve. Aortic valve regurgitation is moderate to severe. Aortic valve mean gradient measures 22.3 mmHg. Aortic valve peak gradient measures 42.6 mmHg. Aortic valve area, by VTI measures 0.87 cm.  6. The inferior vena cava is normal in size with greater than 50% respiratory variability, suggesting right atrial pressure of 3 mmHg. FINDINGS  Left Ventricle: Left ventricular ejection fraction, by estimation, is 60 to 65%. The left ventricle has normal function. The left ventricle has no regional wall motion abnormalities. The left ventricular internal cavity size was normal in size. There is  mild left ventricular hypertrophy. Left ventricular diastolic parameters are consistent with Grade I diastolic dysfunction (impaired relaxation). Normal left ventricular filling pressure. Right Ventricle: The right ventricular size is normal. No increase in right ventricular wall thickness. Right ventricular systolic function is normal. Left Atrium: Left atrial size was mildly dilated. Right Atrium: Right atrial size was normal in size. Pericardium: There is no evidence of pericardial effusion. Mitral Valve: The mitral valve is normal in structure. There is mild thickening of the mitral valve leaflet(s). There is mild calcification of the mitral valve leaflet(s). Mild mitral annular calcification. No evidence of mitral valve regurgitation. No evidence of mitral valve stenosis. Tricuspid Valve: The tricuspid valve is normal in  structure. Tricuspid valve regurgitation is not demonstrated. No evidence of tricuspid stenosis. Aortic Valve: The aortic valve has an indeterminant number of cusps. There is severe calcifcation of the aortic valve. There is severe thickening of the aortic valve. There is severe aortic valve annular calcification. Aortic valve regurgitation is moderate to severe. Aortic valve mean gradient measures 22.3 mmHg. Aortic valve peak gradient measures 42.6 mmHg. Aortic valve area, by VTI measures 0.87 cm. Pulmonic Valve: The pulmonic valve was not well visualized. Pulmonic valve regurgitation is not visualized. No evidence of pulmonic stenosis. Aorta: The aortic root is normal in size and structure. Pulmonary Artery: Indeterminant PASP, inadequate TR jet. Venous: The inferior vena cava is normal in size with greater than 50% respiratory variability, suggesting right atrial pressure of 3 mmHg. IAS/Shunts: No atrial level shunt detected by color flow Doppler.  LEFT VENTRICLE PLAX 2D LVIDd:         4.10 cm  Diastology LVIDs:         2.10 cm  LV e' medial:    6.31 cm/s LV PW:         1.00 cm  LV E/e' medial:  10.0 LV IVS:        1.20 cm  LV e' lateral:   9.46 cm/s LVOT diam:     1.70 cm  LV E/e' lateral: 6.7 LV SV:         65 LV SV Index:   32 LVOT Area:     2.27 cm  RIGHT VENTRICLE RV S prime:     15.80 cm/s TAPSE (M-mode): 2.5 cm LEFT ATRIUM  Index       RIGHT ATRIUM           Index LA diam:        3.10 cm 1.50 cm/m  RA Area:     14.00 cm LA Vol (A2C):   65.1 ml 31.58 ml/m RA Volume:   30.10 ml  14.60 ml/m LA Vol (A4C):   67.5 ml 32.75 ml/m LA Biplane Vol: 68.3 ml 33.14 ml/m  AORTIC VALVE AV Area (Vmax):    0.78 cm AV Area (Vmean):   0.91 cm AV Area (VTI):     0.87 cm AV Vmax:           326.33 cm/s AV Vmean:          214.667 cm/s AV VTI:            0.746 m AV Peak Grad:      42.6 mmHg AV Mean Grad:      22.3 mmHg LVOT Vmax:         112.00 cm/s LVOT Vmean:        85.800 cm/s LVOT VTI:          0.287 m  LVOT/AV VTI ratio: 0.38  AORTA Ao Root diam: 3.90 cm MITRAL VALVE MV Area (PHT): 1.97 cm    SHUNTS MV Decel Time: 386 msec    Systemic VTI:  0.29 m MV E velocity: 63.10 cm/s  Systemic Diam: 1.70 cm MV A velocity: 87.40 cm/s MV E/A ratio:  0.72 Carlyle Dolly MD Electronically signed by Carlyle Dolly MD Signature Date/Time: 12/23/2020/3:34:20 PM    Final     EKG None  Radiology No results found.  Procedures Procedures   Medications Ordered in ED Medications  sodium chloride 0.9 % bolus 1,000 mL (has no administration in time range)  ondansetron (ZOFRAN) injection 4 mg (has no administration in time range)    ED Course  I have reviewed the triage vital signs and the nursing notes.  Pertinent labs & imaging results that were available during my care of the patient were reviewed by me and considered in my medical decision making (see chart for details).    MDM Rules/Calculators/A&P                         Iv ns bolus. zofran iv. Labs.   Reviewed nursing notes and prior charts for additional history.   Labs reviewed/interpeted by me - chem normal w glucose mildly elevated. Wbc normal.   Initially rn indicates pt upset to ct being done - I explained to pt given age, needed to wait for renal fxn - renal fxn ok, ct ordered.   Recheck, pt comfortable appearing, no emesis in ED.   CT tech and rn indicate pt now refusing CT, says he has appt with pcp tomorrow and does not want ct or additional testing in ED.   Trial po fluids. No emesis in ED.  Pt continues to refuse ct.   Pt currently appears stable for d/c.  Return precautions provided.      Final Clinical Impression(s) / ED Diagnoses Final diagnoses:  None    Rx / DC Orders ED Discharge Orders    None       Lajean Saver, MD 01/12/21 2142

## 2021-01-13 ENCOUNTER — Telehealth: Payer: Self-pay

## 2021-01-13 NOTE — Telephone Encounter (Signed)
Transition Care Management Follow-up Telephone Call  Date of discharge and from where: 01/12/21 from [facility]  How have you been since you were released from the hospital? Pt states that he is feeling the same. Pt is still feeling nauseous. Pt stated that he left before he was given discharge instructions. Pt did not get the printed rx for reglan to help with the nausea .  Any questions or concerns? Yes Pt is still feeling nauseous.   Items Reviewed:  Did the pt receive and understand the discharge instructions provided? Yes   Medications obtained and verified? Yes   Other? No   Any new allergies since your discharge? No   Dietary orders reviewed? N/a  Do you have support at home? No   Functional Questionnaire: (I = Independent and D = Dependent) ADLs: I  Bathing/Dressing- I  Meal Prep- I  Eating- I  Maintaining continence- I  Transferring/Ambulation- I  Managing Meds- I  Follow up appointments reviewed:   PCP Hospital f/u appt confirmed? Yes  Scheduled to see Hurshel Party, MD on 01/17/2021 @ 08:00am.  Are transportation arrangements needed? No   If their condition worsens, is the pt aware to call PCP or go to the Emergency Dept.? Yes  Was the patient provided with contact information for the PCP's office or ED? Yes  Was to pt encouraged to call back with questions or concerns? Yes

## 2021-01-17 ENCOUNTER — Encounter (INDEPENDENT_AMBULATORY_CARE_PROVIDER_SITE_OTHER): Payer: Self-pay | Admitting: Internal Medicine

## 2021-01-17 ENCOUNTER — Other Ambulatory Visit: Payer: Self-pay

## 2021-01-17 ENCOUNTER — Ambulatory Visit (INDEPENDENT_AMBULATORY_CARE_PROVIDER_SITE_OTHER): Payer: Medicare Other | Admitting: Internal Medicine

## 2021-01-17 VITALS — BP 104/74 | HR 93 | Temp 96.1°F | Ht 73.0 in | Wt 157.4 lb

## 2021-01-17 DIAGNOSIS — D649 Anemia, unspecified: Secondary | ICD-10-CM

## 2021-01-17 DIAGNOSIS — I10 Essential (primary) hypertension: Secondary | ICD-10-CM

## 2021-01-17 DIAGNOSIS — R6889 Other general symptoms and signs: Secondary | ICD-10-CM

## 2021-01-17 DIAGNOSIS — E119 Type 2 diabetes mellitus without complications: Secondary | ICD-10-CM

## 2021-01-17 DIAGNOSIS — R5381 Other malaise: Secondary | ICD-10-CM

## 2021-01-17 DIAGNOSIS — R5383 Other fatigue: Secondary | ICD-10-CM

## 2021-01-17 DIAGNOSIS — R112 Nausea with vomiting, unspecified: Secondary | ICD-10-CM

## 2021-01-17 MED ORDER — PANTOPRAZOLE SODIUM 40 MG PO TBEC: 40.0000 mg | DELAYED_RELEASE_TABLET | Freq: Every day | ORAL | 3 refills | Status: DC

## 2021-01-17 MED ORDER — ONDANSETRON HCL 4 MG PO TABS: 4.0000 mg | ORAL_TABLET | Freq: Three times a day (TID) | ORAL | 0 refills | Status: DC | PRN

## 2021-01-17 NOTE — Progress Notes (Signed)
Metrics: Intervention Frequency ACO  Documented Smoking Status Yearly  Screened one or more times in 24 months  Cessation Counseling or  Active cessation medication Past 24 months  Past 24 months   Guideline developer: UpToDate (See UpToDate for funding source) Date Released: 2014       Wellness Office Visit  Subjective:  Patient ID: Adam Erickson, male    DOB: 1941-07-13  Age: 80 y.o. MRN: [id number]  CC: This man comes in for an acute visit with symptoms of nausea and vomiting. HPI  [name] has been seeing him for this same problem.  He has gone to the emergency room twice now.  A CT scan of the abdomen has been done which did not show any major abnormalities.  He does not have any significant abdominal pain but he constantly seems to have nausea and vomiting.  Since the end of November last year, approximately 3 months ago, he has lost almost 30 pounds.  This is very significant.  The last blood work he had done at the emergency room showed that he was now anemic. He is a diabetic but his hemoglobin A1c was 7.2% approximately 2 months ago. Past Medical History:  Diagnosis Date  . Diabetes mellitus without complication ([place]) 07/14/2992  . Essential hypertension, benign 02/12/2020  . HLD (hyperlipidemia) 02/12/2020   Past Surgical History:  Procedure Laterality Date  . COLONOSCOPY N/A 07/08/2020   Procedure: COLONOSCOPY;  Surgeon: Rogene Houston, MD;  [facility];  Service: Endoscopy;  Laterality: N/A;  925  . POLYPECTOMY  07/08/2020   Procedure: POLYPECTOMY;  Surgeon: Rogene Houston, MD;  [facility];  Service: Endoscopy;;     Family History  Problem Relation Age of Onset  . Heart disease Mother   . Cancer Father   . Dementia Sister   . Cancer Brother   . Stroke Brother     Social History   Social History Narrative   Divorced in [place].Lives alone.Retired.High School Psychologist, prison and probation services.   Social History   Tobacco Use  . Smoking status: Former  Smoker    Types: Cigars  . Smokeless tobacco: Never Used  Substance Use Topics  . Alcohol use: Not Currently    Current Meds  Medication Sig  . Cholecalciferol (VITAMIN D3) 125 MCG (5000 UT) TABS Take 1 tablet by mouth daily.  Marland Kitchen lisinopril (ZESTRIL) 20 MG tablet Take 1 tablet (20 mg total) by mouth daily.  . metFORMIN (GLUCOPHAGE) 500 MG tablet Take 1 tablet (500 mg total) by mouth 2 (two) times daily with a meal. (Patient taking differently: Take 500 mg by mouth daily with breakfast.)  . ondansetron (ZOFRAN) 4 MG tablet Take 1 tablet (4 mg total) by mouth every 8 (eight) hours as needed for nausea or vomiting.  . pantoprazole (PROTONIX) 40 MG tablet Take 1 tablet (40 mg total) by mouth daily.  . simvastatin (ZOCOR) 40 MG tablet Take 1 tablet (40 mg total) by mouth daily.     [facility] Office Visit from 10/25/2020 in [facility]  PHQ-9 Total Score 0      Objective:   Today's Vitals: BP 104/74   Pulse 93   Temp (!) 96.1 F (35.6 C) (Temporal)   Ht 6\' 1"  (1.854 m)   Wt 157 lb 6.4 oz (71.4 kg)   SpO2 (!) 43%   BMI 20.77 kg/m  Vitals with BMI 01/17/2021 01/12/2021 01/12/2021  Height 6\' 1"  - -  Weight 157 lbs 6 oz - -  BMI 80.32 - -  Systolic 122 482 500  Diastolic 74 83 83  Pulse 93 83 79     Physical Exam   He looks somewhat cachectic than when I have seen him previously.  Blood pressure on the lower end.  Heart sounds are present with a systolic murmur that has been heard before and he is being referred to cardiology for this.  His abdomen is soft and nontender.    Assessment   1. Nausea and vomiting, intractability of vomiting not specified, unspecified vomiting type   2. Essential hypertension, benign   3. Diabetes mellitus without complication ([place])   4. Anemia, unspecified type   5. Malaise and fatigue   6. Other general symptoms and signs        Tests ordered Orders Placed This Encounter  Procedures  . CBC  . COMPLETE METABOLIC PANEL  WITH GFR  . T3, free  . T4, free  . TSH  . Iron,Total/Total Iron Binding Cap  . Sedimentation rate  . B12 and Folate Panel     Plan: 1. We will try and get him an urgent appointment with gastroenterology and I think he probably does need an EGD at the least.  [name] had already made the referral but we will try and get him an urgent appointment this week. 2. In the meantime, I am going to repeat some blood work and try to also see why he has become anemic.  I am somewhat concerned about the 30 pound weight loss in the last 3 months and I would be concerned about malignancy. 3. Further recommendations will depend on blood results.   Meds ordered this encounter  Medications  . pantoprazole (PROTONIX) 40 MG tablet    Sig: Take 1 tablet (40 mg total) by mouth daily.    Dispense:  30 tablet    Refill:  3  . ondansetron (ZOFRAN) 4 MG tablet    Sig: Take 1 tablet (4 mg total) by mouth every 8 (eight) hours as needed for nausea or vomiting.    Dispense:  20 tablet    Refill:  0    Nimish Luther Parody, MD

## 2021-01-18 ENCOUNTER — Other Ambulatory Visit (INDEPENDENT_AMBULATORY_CARE_PROVIDER_SITE_OTHER): Payer: Self-pay

## 2021-01-18 DIAGNOSIS — R112 Nausea with vomiting, unspecified: Secondary | ICD-10-CM

## 2021-01-18 DIAGNOSIS — R634 Abnormal weight loss: Secondary | ICD-10-CM

## 2021-01-18 LAB — CBC
HCT: 36.3 % — ABNORMAL LOW (ref 38.5–50.0)
Hemoglobin: 12.2 g/dL — ABNORMAL LOW (ref 13.2–17.1)
MCH: 28.8 pg (ref 27.0–33.0)
MCHC: 33.6 g/dL (ref 32.0–36.0)
MCV: 85.6 fL (ref 80.0–100.0)
MPV: 9.3 fL (ref 7.5–12.5)
Platelets: 647 10*3/uL — ABNORMAL HIGH (ref 140–400)
RBC: 4.24 10*6/uL (ref 4.20–5.80)
RDW: 11.3 % (ref 11.0–15.0)
WBC: 8 10*3/uL (ref 3.8–10.8)

## 2021-01-18 LAB — B12 AND FOLATE PANEL
Folate: 7.2 ng/mL
Vitamin B-12: 485 pg/mL (ref 200–1100)

## 2021-01-18 LAB — T4, FREE: Free T4: 1.4 ng/dL (ref 0.8–1.8)

## 2021-01-18 LAB — COMPLETE METABOLIC PANEL WITH GFR
AG Ratio: 1 (calc) (ref 1.0–2.5)
ALT: 11 U/L (ref 9–46)
AST: 12 U/L (ref 10–35)
Albumin: 3.5 g/dL — ABNORMAL LOW (ref 3.6–5.1)
Alkaline phosphatase (APISO): 97 U/L (ref 35–144)
BUN: 14 mg/dL (ref 7–25)
CO2: 29 mmol/L (ref 20–32)
Calcium: 9.6 mg/dL (ref 8.6–10.3)
Chloride: 97 mmol/L — ABNORMAL LOW (ref 98–110)
Creat: 0.87 mg/dL (ref 0.70–1.18)
GFR, Est African American: 95 mL/min/{1.73_m2} (ref >=60)
GFR, Est Non African American: 82 mL/min/{1.73_m2} (ref >=60)
Globulin: 3.6 g/dL (calc) (ref 1.9–3.7)
Glucose, Bld: 186 mg/dL — ABNORMAL HIGH (ref 65–99)
Potassium: 4.5 mmol/L (ref 3.5–5.3)
Sodium: 135 mmol/L (ref 135–146)
Total Bilirubin: 0.6 mg/dL (ref 0.2–1.2)
Total Protein: 7.1 g/dL (ref 6.1–8.1)

## 2021-01-18 LAB — IRON, TOTAL/TOTAL IRON BINDING CAP
%SAT: 9 % (calc) — ABNORMAL LOW (ref 20–48)
Iron: 20 ug/dL — ABNORMAL LOW (ref 50–180)
TIBC: 219 mcg/dL (calc) — ABNORMAL LOW (ref 250–425)

## 2021-01-18 LAB — TSH: TSH: 3.76 mIU/L (ref 0.40–4.50)

## 2021-01-18 LAB — SEDIMENTATION RATE: Sed Rate: 75 mm/h — ABNORMAL HIGH (ref 0–20)

## 2021-01-18 LAB — T3, FREE: T3, Free: 3.1 pg/mL (ref 2.3–4.2)

## 2021-01-18 NOTE — Progress Notes (Signed)
Re entered the referral due to it was closed by referred provider. Pt is a pt of Dr Laural Golden office. They are working him in with new provider this week. Per staff or Dr Laural Golden office.

## 2021-01-18 NOTE — Progress Notes (Signed)
Called patient. Is not able to leave a message. Also, Dr Laural Golden office has got a appt for him this week. Thewy are calling as well to get him in soon.

## 2021-01-18 NOTE — Progress Notes (Signed)
Please call the patient and let him know that his anemia is slightly better but he still anemic.  He appears to be deficient in iron and also an inflammatory marker is elevated which is somewhat concerning.  He needs to see gastroenterology ASAP so please try and get him an appointment.  Thanks.

## 2021-01-20 ENCOUNTER — Ambulatory Visit (INDEPENDENT_AMBULATORY_CARE_PROVIDER_SITE_OTHER): Payer: Medicare Other | Admitting: Gastroenterology

## 2021-01-25 DIAGNOSIS — Z9889 Other specified postprocedural states: Secondary | ICD-10-CM

## 2021-01-25 HISTORY — DX: Other specified postprocedural states: Z98.890

## 2021-02-14 ENCOUNTER — Encounter: Payer: Self-pay | Admitting: Cardiology

## 2021-02-23 ENCOUNTER — Ambulatory Visit (INDEPENDENT_AMBULATORY_CARE_PROVIDER_SITE_OTHER): Payer: Medicare Other | Admitting: Nurse Practitioner

## 2021-02-23 ENCOUNTER — Encounter (INDEPENDENT_AMBULATORY_CARE_PROVIDER_SITE_OTHER): Payer: Self-pay | Admitting: Nurse Practitioner

## 2021-02-23 ENCOUNTER — Other Ambulatory Visit: Payer: Self-pay

## 2021-02-23 VITALS — BP 118/68 | HR 69 | Temp 97.3°F | Ht 73.0 in | Wt 167.6 lb

## 2021-02-23 DIAGNOSIS — E119 Type 2 diabetes mellitus without complications: Secondary | ICD-10-CM | POA: Diagnosis not present

## 2021-02-23 DIAGNOSIS — D649 Anemia, unspecified: Secondary | ICD-10-CM

## 2021-02-23 DIAGNOSIS — R112 Nausea with vomiting, unspecified: Secondary | ICD-10-CM

## 2021-02-23 DIAGNOSIS — I10 Essential (primary) hypertension: Secondary | ICD-10-CM | POA: Diagnosis not present

## 2021-02-23 DIAGNOSIS — E782 Mixed hyperlipidemia: Secondary | ICD-10-CM | POA: Diagnosis not present

## 2021-02-23 MED ORDER — LISINOPRIL 20 MG PO TABS
20.0000 mg | ORAL_TABLET | Freq: Every day | ORAL | 1 refills | Status: AC
Start: 2021-02-23 — End: ?

## 2021-02-23 MED ORDER — METFORMIN HCL 500 MG PO TABS: 500.0000 mg | ORAL_TABLET | Freq: Every day | ORAL | 1 refills | Status: AC

## 2021-02-23 MED ORDER — PANTOPRAZOLE SODIUM 40 MG PO TBEC: 40.0000 mg | DELAYED_RELEASE_TABLET | Freq: Every day | ORAL | 1 refills | Status: AC

## 2021-02-23 MED ORDER — SIMVASTATIN 40 MG PO TABS
40.0000 mg | ORAL_TABLET | Freq: Every day | ORAL | 1 refills | Status: AC
Start: 2021-02-23 — End: ?

## 2021-02-23 MED ORDER — METFORMIN HCL 500 MG PO TABS: 500.0000 mg | ORAL_TABLET | Freq: Every day | ORAL | Status: DC

## 2021-02-23 NOTE — Progress Notes (Signed)
Subjective:  Patient ID: Adam Erickson, male    DOB: [birth year]  Age: 80 y.o. MRN: [id number]  CC:  Chief Complaint  Patient presents with  . Follow-up    Patient is concerned about being constipated daily and also urinates frequently and too quickly  . Diabetes  . Anemia  . Hypertension  . Hyperlipidemia      HPI  This patient arrives today for the above.  Diabetes: He continues on metformin but he tells me he is taking it once a day as opposed to twice a day.  Last A1c was 7.3 and this was collected approximately 3 months ago.  This is a improvement from 10.  He is on ACE inhibitor and statin.  Anemia: He does have a history of iron deficiency anemia.  Last hemoglobin was approximate 12.2.  He had colonoscopy completed within the last year and this did not show any cancer or precancerous growths.  He did have a couple of benign polyps noted.  He has started a daily iron supplement, he reports starting this approximately 2 weeks ago.  Hypertension: He continues on lisinopril and is tolerating this well.  Hyperlipidemia: He continues on simvastatin and his last LDL was 61.  Nausea/vomiting: He is having significant nausea and vomiting to the point where he is having a hard time eating any foods.  He was referred to gastroenterology and tells me he did have an appointment scheduled but before his appointment came up his symptoms resolved.  He is no longer experiencing nausea or vomiting.  Past Medical History:  Diagnosis Date  . Diabetes mellitus without complication ([place]) 3/76/2831  . Essential hypertension, benign 02/12/2020  . History of ear surgery 01/2021   Right ear  . HLD (hyperlipidemia) 02/12/2020      Family History  Problem Relation Age of Onset  . Heart disease Mother   . Cancer Father   . Dementia Sister   . Cancer Brother   . Stroke Brother     Social History   Social History Narrative   Divorced in [place].Lives alone.Retired.High School Mining engineer.   Social History   Tobacco Use  . Smoking status: Former Smoker    Types: Cigars  . Smokeless tobacco: Never Used  Substance Use Topics  . Alcohol use: Not Currently     Current Meds  Medication Sig  . Cholecalciferol (VITAMIN D3) 125 MCG (5000 UT) TABS Take 1 tablet by mouth daily.  . ferrous sulfate 325 (65 FE) MG tablet Take 325 mg by mouth daily with breakfast.  . [DISCONTINUED] lisinopril (ZESTRIL) 20 MG tablet Take 1 tablet (20 mg total) by mouth daily.  . [DISCONTINUED] metFORMIN (GLUCOPHAGE) 500 MG tablet Take 1 tablet (500 mg total) by mouth 2 (two) times daily with a meal. (Patient taking differently: Take 500 mg by mouth daily with breakfast.)  . [DISCONTINUED] pantoprazole (PROTONIX) 40 MG tablet Take 1 tablet (40 mg total) by mouth daily.  . [DISCONTINUED] simvastatin (ZOCOR) 40 MG tablet Take 1 tablet (40 mg total) by mouth daily.    ROS:  Review of Systems  Respiratory: Negative for shortness of breath.   Cardiovascular: Negative for chest pain.  Gastrointestinal: Negative for abdominal pain, blood in stool, nausea and vomiting.  Neurological: Negative for dizziness and headaches.     Objective:   Today's Vitals: BP 118/68   Pulse 69   Temp (!) 97.3 F (36.3 C) (Temporal)   Ht 6\' 1"  (1.854 m)  Wt 167 lb 9.6 oz (76 kg)   SpO2 99%   BMI 22.11 kg/m  Vitals with BMI 02/23/2021 01/17/2021 01/12/2021  Height 6\' 1"  6\' 1"  -  Weight 167 lbs 10 oz 157 lbs 6 oz -  BMI 09.60 45.40 -  Systolic 981 191 478  Diastolic 68 74 83  Pulse 69 93 83     Physical Exam Vitals reviewed.  Constitutional:      Appearance: Normal appearance.  HENT:     Head: Normocephalic and atraumatic.  Cardiovascular:     Rate and Rhythm: Normal rate and regular rhythm.  Pulmonary:     Effort: Pulmonary effort is normal.     Breath sounds: Normal breath sounds.  Musculoskeletal:     Cervical back: Neck supple.  Skin:    General: Skin is warm and dry.  Neurological:      Mental Status: He is alert and oriented to person, place, and time.  Psychiatric:        Mood and Affect: Mood normal.        Behavior: Behavior normal.        Thought Content: Thought content normal.        Judgment: Judgment normal.          Assessment and Plan   1. Essential hypertension, benign   2. Diabetes mellitus without complication ([place])   3. Mixed hyperlipidemia   4. Nausea and vomiting, intractability of vomiting not specified, unspecified vomiting type   5. Anemia, unspecified type      Plan: 1.  Blood pressure at goal he will continue on his lisinopril 2.  He will continue on his Metformin daily.  We will check A1c at next office visit. 3.  We will continue his medications for hyperlipidemia. 4.  Appears to be resolved.  He is encouraged to know if symptoms return. 5.  He will continue on his iron supplement we will check repeat CBC and iron levels at next office visit.   Tests ordered No orders of the defined types were placed in this encounter.     Meds ordered this encounter  Medications  . DISCONTD: metFORMIN (GLUCOPHAGE) 500 MG tablet    Sig: Take 1 tablet (500 mg total) by mouth daily with breakfast.    Order Specific Question:   Supervising Provider    Answer:   Hurshel Party C [[id number]]  . lisinopril (ZESTRIL) 20 MG tablet    Sig: Take 1 tablet (20 mg total) by mouth daily.    Dispense:  90 tablet    Refill:  1    Order Specific Question:   Supervising Provider    Answer:   Hurshel Party C [[id number]]  . metFORMIN (GLUCOPHAGE) 500 MG tablet    Sig: Take 1 tablet (500 mg total) by mouth daily with breakfast.    Dispense:  90 tablet    Refill:  1    Order Specific Question:   Supervising Provider    Answer:   Hurshel Party C [[id number]]  . pantoprazole (PROTONIX) 40 MG tablet    Sig: Take 1 tablet (40 mg total) by mouth daily.    Dispense:  90 tablet    Refill:  1    Order Specific Question:   Supervising Provider    Answer:   Hurshel Party  C [[id number]]  . simvastatin (ZOCOR) 40 MG tablet    Sig: Take 1 tablet (40 mg total) by mouth daily.    Dispense:  90 tablet  Refill:  1    Order Specific Question:   Supervising Provider    Answer:   Doree Albee [[id number]]    Patient to follow-up in 2 to 3 months or sooner as needed.  Ailene Ards, NP

## 2021-03-25 ENCOUNTER — Encounter: Payer: Self-pay | Admitting: Internal Medicine

## 2021-03-28 ENCOUNTER — Ambulatory Visit: Payer: Medicare Other | Admitting: Cardiology

## 2021-04-27 ENCOUNTER — Ambulatory Visit (INDEPENDENT_AMBULATORY_CARE_PROVIDER_SITE_OTHER): Payer: Medicare Other | Admitting: Nurse Practitioner

## 2021-05-02 HISTORY — PX: CYST REMOVAL TRUNK: SHX6283

## 2021-05-05 ENCOUNTER — Other Ambulatory Visit: Payer: Self-pay

## 2021-05-05 ENCOUNTER — Ambulatory Visit (INDEPENDENT_AMBULATORY_CARE_PROVIDER_SITE_OTHER): Payer: Medicare Other | Admitting: Nurse Practitioner

## 2021-05-05 ENCOUNTER — Encounter (INDEPENDENT_AMBULATORY_CARE_PROVIDER_SITE_OTHER): Payer: Self-pay | Admitting: Nurse Practitioner

## 2021-05-05 VITALS — BP 130/80 | HR 67 | Temp 97.0°F | Ht 69.25 in | Wt 180.6 lb

## 2021-05-05 DIAGNOSIS — I10 Essential (primary) hypertension: Secondary | ICD-10-CM | POA: Diagnosis not present

## 2021-05-05 DIAGNOSIS — D509 Iron deficiency anemia, unspecified: Secondary | ICD-10-CM

## 2021-05-05 DIAGNOSIS — Z1159 Encounter for screening for other viral diseases: Secondary | ICD-10-CM

## 2021-05-05 DIAGNOSIS — Z0000 Encounter for general adult medical examination without abnormal findings: Secondary | ICD-10-CM

## 2021-05-05 DIAGNOSIS — E119 Type 2 diabetes mellitus without complications: Secondary | ICD-10-CM

## 2021-05-05 NOTE — Progress Notes (Signed)
Subjective:   Adam Erickson is a 80 y.o. male who presents for Medicare Annual/Subsequent preventive examination.  Review of Systems     Cardiac Risk Factors include: advanced age (>62men, >85 women);hypertension;diabetes mellitus;male gender     Objective:    Today's Vitals   05/05/21 1405  BP: 130/80  Pulse: 67  Temp: (!) 97 F (36.1 C)  TempSrc: Temporal  SpO2: 98%  Weight: 180 lb 9.6 oz (81.9 kg)  Height: 5' 9.25" (1.759 m)   Body mass index is 26.48 kg/m.  Advanced Directives 05/05/2021 01/12/2021 07/08/2020  Does Patient Have a Medical Advance Directive? No No No  Would patient like information on creating a medical advance directive? No - Patient declined - No - Patient declined    Current Medications (verified) Outpatient Encounter Medications as of 05/05/2021  Medication Sig   Cholecalciferol (VITAMIN D3) 125 MCG (5000 UT) TABS Take 1 tablet by mouth daily.   ferrous sulfate 325 (65 FE) MG tablet Take 325 mg by mouth daily with breakfast.   lisinopril (ZESTRIL) 20 MG tablet Take 1 tablet (20 mg total) by mouth daily.   metFORMIN (GLUCOPHAGE) 500 MG tablet Take 1 tablet (500 mg total) by mouth daily with breakfast.   pantoprazole (PROTONIX) 40 MG tablet Take 1 tablet (40 mg total) by mouth daily.   simvastatin (ZOCOR) 40 MG tablet Take 1 tablet (40 mg total) by mouth daily.   No facility-administered encounter medications on file as of 05/05/2021.    Allergies (verified) Patient has no known allergies.   History: Past Medical History:  Diagnosis Date   Diabetes mellitus without complication ([place]) 1/88/4166   Essential hypertension, benign 02/12/2020   History of ear surgery 01/2021   Right ear   HLD (hyperlipidemia) 02/12/2020   Past Surgical History:  Procedure Laterality Date   COLONOSCOPY N/A 07/08/2020   Procedure: COLONOSCOPY;  Surgeon: Adam Houston, MD;  [facility];  Service: Endoscopy;  Laterality: N/A;  925   CYST REMOVAL TRUNK   05/02/2021   Removed from back   POLYPECTOMY  07/08/2020   Procedure: POLYPECTOMY;  Surgeon: Adam Houston, MD;  Location: [facility];  Service: Endoscopy;;   Family History  Problem Relation Age of Onset   Heart disease Mother    Cancer Father    Dementia Sister    Cancer Brother    Stroke Brother    Social History   Socioeconomic History   Marital status: Single    Spouse name: Not on file   Number of children: Not on file   Years of education: Not on file   Highest education level: Not on file  Occupational History   Not on file  Tobacco Use   Smoking status: Former    Pack years: 0.00    Types: Cigars   Smokeless tobacco: Never  Vaping Use   Vaping Use: Never used  Substance and Sexual Activity   Alcohol use: Not Currently   Drug use: Never   Sexual activity: Not on file  Other Topics Concern   Not on file  Social History Narrative   Divorced in [place].Lives alone.Retired.High School Psychologist, prison and probation services.   Social Determinants of Health   Financial Resource Strain: Not on file  Food Insecurity: Not on file  Transportation Needs: Not on file  Physical Activity: Not on file  Stress: Not on file  Social Connections: Not on file    Tobacco Counseling Counseling given: Yes   Clinical Intake:  Pre-visit preparation completed: Yes  Pain : No/denies pain     BMI - recorded: 26.48 Nutritional Status: BMI 25 -29 Overweight Nutritional Risks: None Diabetes: Yes CBG done?: No Did pt. bring in CBG monitor from home?: No  How often do you need to have someone help you when you read instructions, pamphlets, or other written materials from your doctor or pharmacy?: 1 - Never What is the last grade level you completed in school?: 4 year college degree  Diabetic?Yes  Interpreter Needed?: No  Information entered by :: Adam Ruths, NP-C   Activities of Daily Living In your present state of health, do you have any difficulty performing the following  activities: 05/05/2021  Hearing? Y  Vision? N  Difficulty concentrating or making decisions? N  Walking or climbing stairs? N  Dressing or bathing? N  Doing errands, shopping? N  Preparing Food and eating ? N  Using the Toilet? N  In the past six months, have you accidently leaked urine? N  Do you have problems with loss of bowel control? N  Managing your Medications? N  Managing your Finances? Y  Comment Daughter will assist  Housekeeping or managing your Housekeeping? N  Some recent data might be hidden    Patient Care Team: Adam Albee, MD as PCP - General (Internal Medicine)  Indicate any recent Medical Services you may have received from other than [facility] providers in the past year (date may be approximate).     Assessment:   This is a routine wellness examination for [name].  Hearing/Vision screen No results found.  Dietary issues and exercise activities discussed: Current Exercise Habits: Home exercise routine, Type of exercise: walking (will walk dog when able), Exercise limited by: None identified   Goals Addressed   None    Depression Screen PHQ 2/9 Scores 05/05/2021 02/23/2021 10/25/2020 03/09/2020  PHQ - 2 Score 0 0 0 0  PHQ- 9 Score 0 0 0 -  Exception Documentation - - - Medical reason    Fall Risk Fall Risk  05/05/2021 02/23/2021 10/25/2020 03/09/2020  Falls in the past year? 0 0 0 0  Number falls in past yr: - - - 0  Injury with Fall? - - - 0  Risk for fall due to : - - - No Fall Risks  Follow up - - - Falls evaluation completed    [place]:  Any stairs in or around the home? No  If so, are there any without handrails?  N/A Home free of loose throw rugs in walkways, pet beds, electrical cords, etc? Yes  Adequate lighting in your home to reduce risk of falls? Yes   ASSISTIVE DEVICES UTILIZED TO PREVENT FALLS:  Life alert? No  Use of a cane, walker or w/c? Yes  - Cane Grab bars in the bathroom? Yes  Shower  chair or bench in shower? No  Elevated toilet seat or a handicapped toilet? No   TIMED UP AND GO:  Was the test performed? Yes .  Length of time to ambulate 10 feet: 10 sec.   Gait steady and fast without use of assistive device  Cognitive Function:     6CIT Screen 05/05/2021  What Year? 0 points  What month? 0 points  What time? 0 points  Count back from 20 0 points  Months in reverse 0 points  Repeat phrase 4 points  Total Score 4    Immunizations Immunization History  Administered Date(s) Administered  PFIZER(Purple Top)SARS-COV-2 Vaccination 03/02/2020, 03/23/2020   Pneumococcal Polysaccharide-23 10/25/2020    TDAP status: Due, Education has been provided regarding the importance of this vaccine. Advised may receive this vaccine at local pharmacy or Health Dept. Aware to provide a copy of the vaccination record if obtained from local pharmacy or Health Dept. Verbalized acceptance and understanding.  Flu Vaccine status: Declined, Education has been provided regarding the importance of this vaccine but patient still declined. Advised may receive this vaccine at local pharmacy or Health Dept. Aware to provide a copy of the vaccination record if obtained from local pharmacy or Health Dept. Verbalized acceptance and understanding.  Pneumococcal vaccine status: Up to date  Covid-19 vaccine status: Completed vaccines  Qualifies for Shingles Vaccine? Yes   Zostavax completed No   Shingrix Completed?: No.    Education has been provided regarding the importance of this vaccine. Patient has been advised to call insurance company to determine out of pocket expense if they have not yet received this vaccine. Advised may also receive vaccine at local pharmacy or Health Dept. Verbalized acceptance and understanding.  Screening Tests Health Maintenance  Topic Date Due   FOOT EXAM  Never done   OPHTHALMOLOGY EXAM  Never done   Hepatitis C Screening  Never done   TETANUS/TDAP  Never  done   Zoster Vaccines- Shingrix (1 of 2) Never done   COVID-19 Vaccine (3 - Booster for Pfizer series) 08/23/2020   HEMOGLOBIN A1C  05/11/2021   INFLUENZA VACCINE  06/27/2021   PNA vac Low Risk Adult (2 of 2 - PCV13) 10/25/2021   Pneumococcal Vaccine 90-79 Years old  Aged Out   HPV VACCINES  Aged Out    Health Maintenance  Health Maintenance Due  Topic Date Due   FOOT EXAM  Never done   OPHTHALMOLOGY EXAM  Never done   Hepatitis C Screening  Never done   TETANUS/TDAP  Never done   Zoster Vaccines- Shingrix (1 of 2) Never done   COVID-19 Vaccine (3 - Booster for Pfizer series) 08/23/2020    Colorectal cancer screening: No longer required.   Lung Cancer Screening: (Low Dose CT Chest recommended if Age 43-80 years, 30 pack-year currently smoking OR have quit w/in 15years.) does not qualify.   Lung Cancer Screening Referral: N/A  Additional Screening:  Hepatitis C Screening: does qualify; Completed today  Vision Screening: Recommended annual ophthalmology exams for early detection of glaucoma and other disorders of the eye. Is the patient up to date with their annual eye exam?  No  Who is the provider or what is the name of the office in which the patient attends annual eye exams? N/A If pt is not established with a provider, would they like to be referred to a provider to establish care? No .   Dental Screening: Recommended annual dental exams for proper oral hygiene  Community Resource Referral / Chronic Care Management: CRR required this visit?  No   CCM required this visit?  No       Diabetic foot exam: Skin warm dry and intact, reduced hair growth bilateral lower extremities, mild 1+ pitting edema noted to bilateral lower extremities.  Fungal disease noted to toenails bilaterally.  Dorsalis pedis 2+ bilaterally.  Monofilament testing showed right foot 9/10 and left foot 4/10.  Plan:     We will screen for hepatitis C via blood work today.  We will also check A1c,  CBC, iron, and ferritin levels.  It appears that it was recommended  he see a gastroenterologist about 4 months ago for evaluation of his anemia as well as elevated sed rate.  Does not appear that office visit with gastroenterologist to be completed.  I do see that approximately 10 months ago he did undergo colonoscopy which did not show any cancer.  Will reorder referral to gastroenterology for further evaluation of his anemia.  Patient is on a iron supplement and he will continue taking this as prescribed.  I have personally reviewed and noted the following in the patient's chart:   Medical and social history Use of alcohol, tobacco or illicit drugs  Current medications and supplements including opioid prescriptions. Patient is not currently taking opioid prescriptions. Functional ability and status Nutritional status Physical activity Advanced directives List of other physicians Hospitalizations, surgeries, and ER visits in previous 12 months Vitals Screenings to include cognitive, depression, and falls Referrals and appointments  In addition, I have reviewed and discussed with patient certain preventive protocols, quality metrics, and best practice recommendations. A written personalized care plan for preventive services as well as general preventive health recommendations were provided to patient.    Patient to follow-up in 3 months or sooner as needed. Ailene Ards, NP   05/05/2021

## 2021-05-05 NOTE — Patient Instructions (Signed)
  Mr. Adam Erickson , Thank you for taking time to come for your Medicare Wellness Visit. I appreciate your ongoing commitment to your health goals. Please review the following plan we discussed and let me know if I can assist you in the future.   These are the goals we discussed:  Goals   None     This is a list of the screening recommended for you and due dates:  Health Maintenance  Topic Date Due   Hepatitis C Screening: USPSTF Recommendation to screen - Ages 19-79 yo.  Never done   COVID-19 Vaccine (3 - Booster for Pfizer series) 08/23/2020   Eye exam for diabetics  05/05/2021*   Zoster (Shingles) Vaccine (1 of 2) 08/05/2021*   Tetanus Vaccine  05/05/2022*   Hemoglobin A1C  05/11/2021   Flu Shot  06/27/2021   Pneumonia vaccines (2 of 2 - PCV13) 10/25/2021   Complete foot exam   05/05/2022   Pneumococcal Vaccination  Aged Out   HPV Vaccine  Aged Out  *Topic was postponed. The date shown is not the original due date.

## 2021-05-06 ENCOUNTER — Encounter (INDEPENDENT_AMBULATORY_CARE_PROVIDER_SITE_OTHER): Payer: Self-pay | Admitting: *Deleted

## 2021-05-09 LAB — HEPATITIS C ANTIBODY

## 2021-05-09 LAB — ABN TEST REFUSAL

## 2021-05-09 LAB — IRON

## 2021-05-09 LAB — FERRITIN

## 2021-05-09 LAB — CBC WITH DIFFERENTIAL/PLATELET

## 2021-05-10 ENCOUNTER — Other Ambulatory Visit (INDEPENDENT_AMBULATORY_CARE_PROVIDER_SITE_OTHER): Payer: Self-pay | Admitting: Nurse Practitioner

## 2021-05-10 DIAGNOSIS — Z1159 Encounter for screening for other viral diseases: Secondary | ICD-10-CM

## 2021-05-10 DIAGNOSIS — D509 Iron deficiency anemia, unspecified: Secondary | ICD-10-CM

## 2021-05-10 DIAGNOSIS — Z0000 Encounter for general adult medical examination without abnormal findings: Secondary | ICD-10-CM

## 2021-05-10 NOTE — Progress Notes (Signed)
Please call this patient and get him rescheduled for lab draw as his blood work did not result due to the blood samples not being picked up over the weekend. The labs have been reordered.

## 2021-05-10 NOTE — Progress Notes (Signed)
Patient stated that he will wait until next time he is in the office to get his blood drawn again.  He stated that it was to much just to drive all the way here to give blood.  06/09/21 kw

## 2021-06-27 ENCOUNTER — Ambulatory Visit (HOSPITAL_BASED_OUTPATIENT_CLINIC_OR_DEPARTMENT_OTHER): Payer: Medicare Other | Admitting: Cardiology

## 2021-06-30 ENCOUNTER — Encounter (INDEPENDENT_AMBULATORY_CARE_PROVIDER_SITE_OTHER): Payer: Self-pay

## 2021-08-10 ENCOUNTER — Ambulatory Visit (INDEPENDENT_AMBULATORY_CARE_PROVIDER_SITE_OTHER): Payer: Medicare Other | Admitting: Internal Medicine

## 2021-08-22 IMAGING — CT CT ABD-PELV W/ CM
2 of 5 series · 15 of 46 positions shown, 17 images · IV contrast (Omnipaque or Isovue)
Comparison: None

CLINICAL DATA: Acute, nonlocalized abdominal pain, repetitive
vomiting.

EXAM:
CT ABDOMEN AND PELVIS WITH CONTRAST
TECHNIQUE: Multidetector CT imaging of the abdomen and pelvis was performed
using the standard protocol following bolus administration of
intravenous contrast.
CONTRAST:  100mL OMNIPAQUE IOHEXOL 300 MG/ML  SOLN

[Series 2: axial st · axial · 0.80mm/px · z∈[+1376,+1786]mm · 12 of 94 slices shown, 14 images]
[im 6/94  soft-tissue]
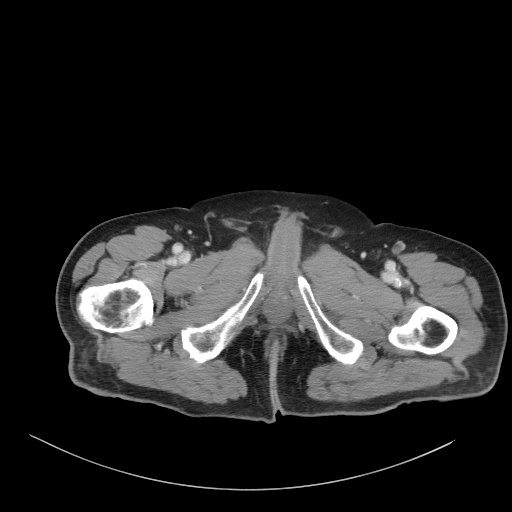
[im 6/94  bone]
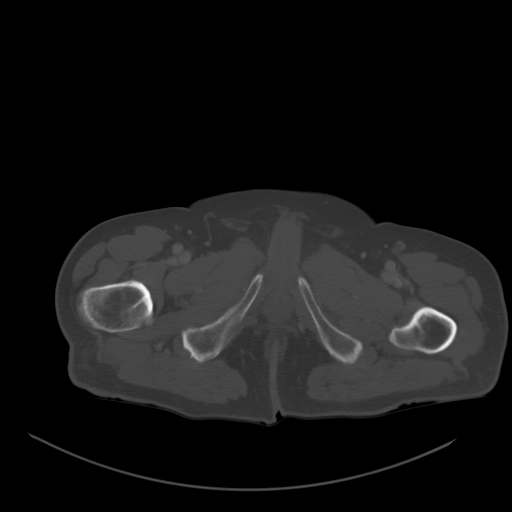
[im 16/94  soft-tissue]
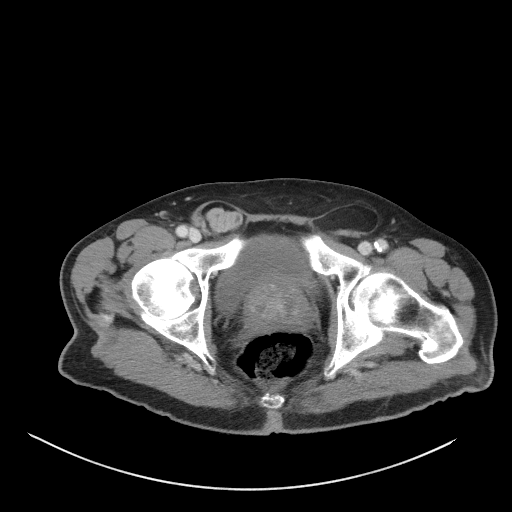
[im 21/94  soft-tissue]
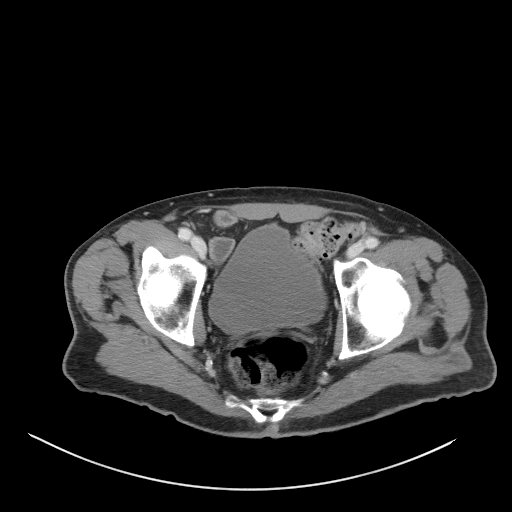
[im 26/94  soft-tissue]
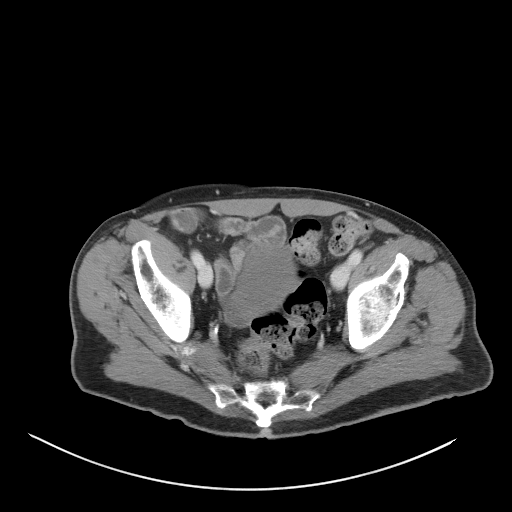
[im 37/94  soft-tissue]
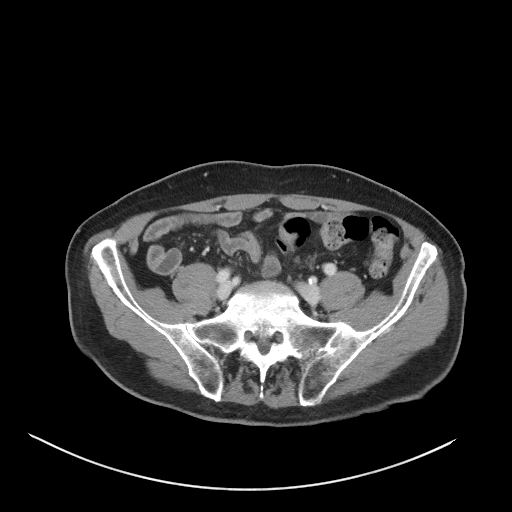
[im 42/94  soft-tissue]
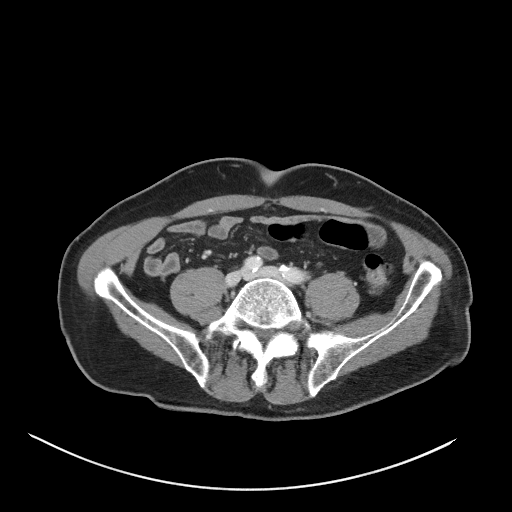
[im 52/94  soft-tissue]
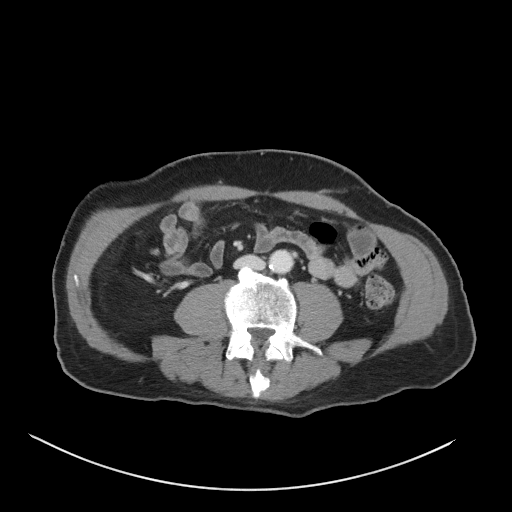
[im 57/94  soft-tissue]
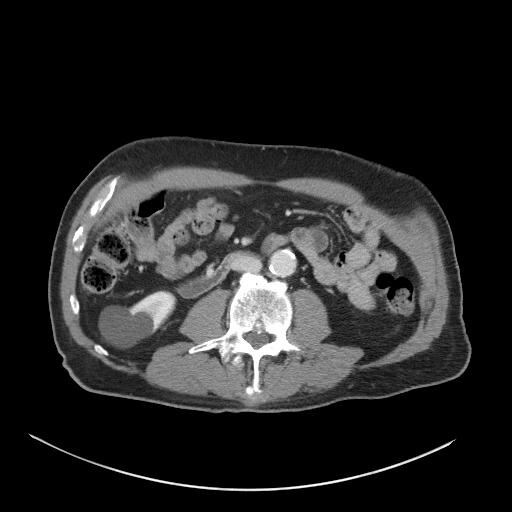
[im 68/94  soft-tissue]
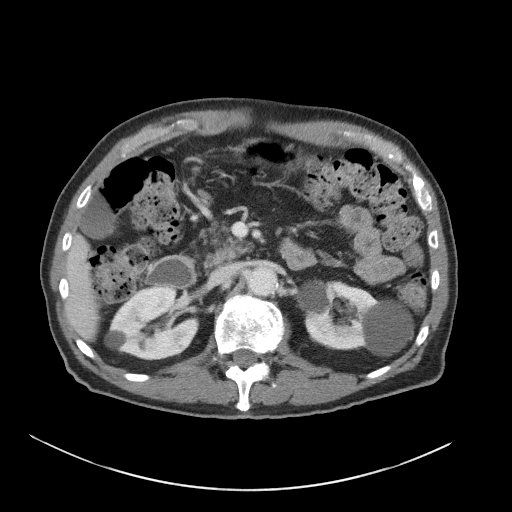
[im 68/94  bone]
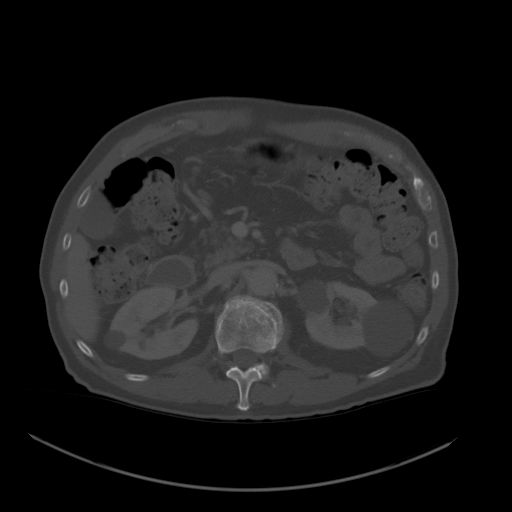
[im 73/94  soft-tissue]
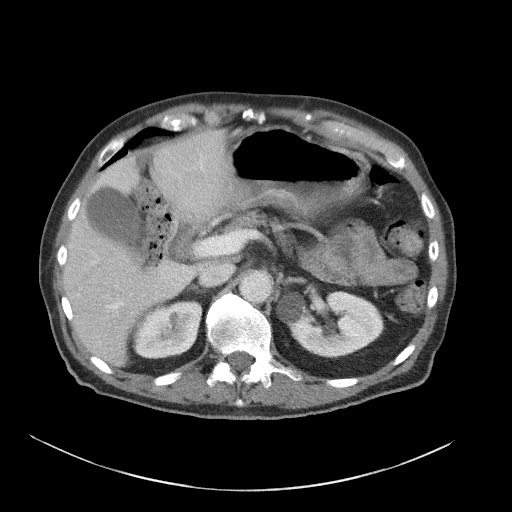
[im 78/94  soft-tissue]
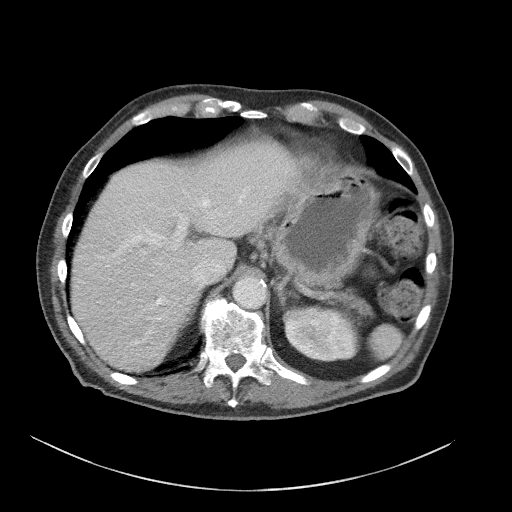
[im 88/94  soft-tissue]
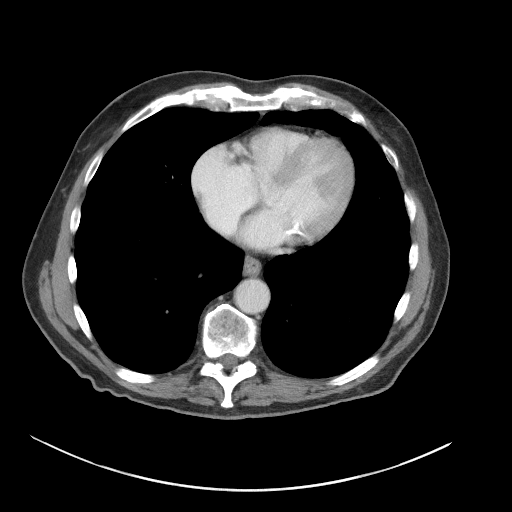

[Series 5: coronal st · coronal · 0.75mm/px · 3 of 91 slices shown]
[im 31/91  soft-tissue]
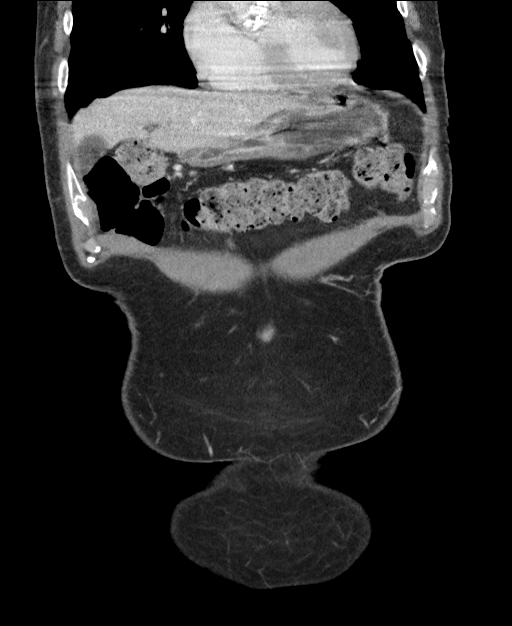
[im 41/91  soft-tissue]
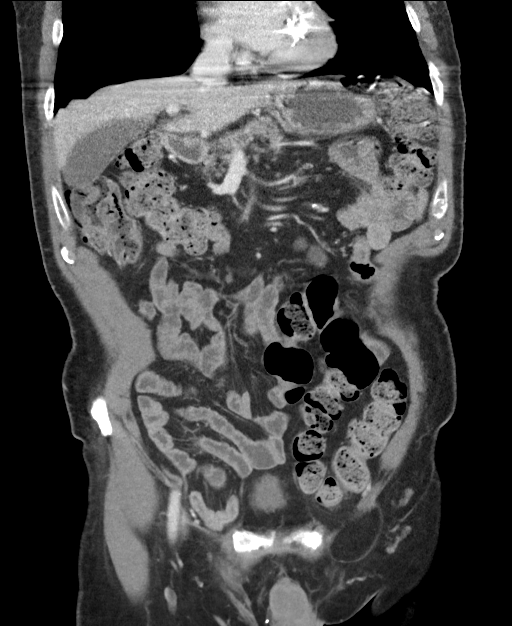
[im 51/91  soft-tissue]
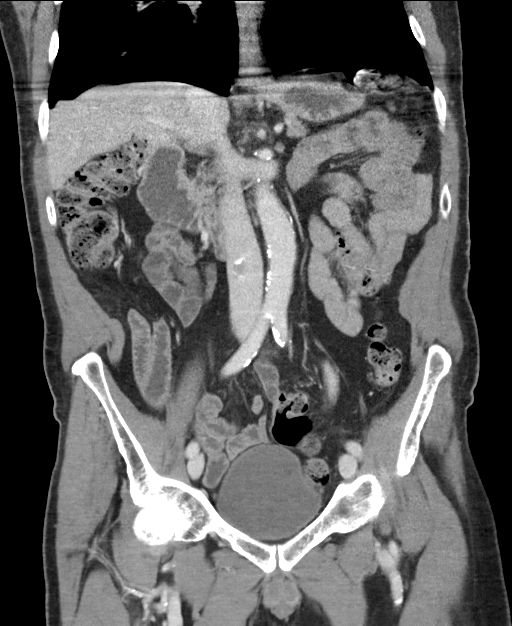

[15 of 46 positions shown; findings below may reference images not displayed]

FINDINGS: Lower chest: The visualized lung bases are clear bilaterally.
Extensive calcification of the aortic valve leaflets are identified.
Moderate calcification of the mitral valve annulus. Mild coronary
artery calcification. Global cardiac size within normal limits.
Small hiatal hernia

Hepatobiliary: No focal liver abnormality is seen. No gallstones,
gallbladder wall thickening, or biliary dilatation.

Pancreas: Unremarkable

Spleen: Unremarkable

Adrenals/Urinary Tract: The adrenal glands are unremarkable. The
kidneys are normal in size and position. Multiple simple cortical
cysts are seen within the kidneys bilaterally. The kidneys are
otherwise unremarkable. The bladder is unremarkable.

Stomach/Bowel: Single loop of unremarkable small bowel seen within a
moderate right inguinal hernia. Stomach, small bowel, and appendix
are otherwise unremarkable. Moderate stool seen throughout the colon
without evidence of obstruction. The colon is otherwise
unremarkable. No evidence of focal inflammation. No free
intraperitoneal gas or fluid.

Vascular/Lymphatic: Moderate aortoiliac atherosclerotic
calcification. No aortic aneurysm. No pathologic adenopathy within
the abdomen and pelvis.

Reproductive: The prostate gland is moderately enlarged. Seminal
vesicles are unremarkable.

Other: Moderate fat containing left inguinal hernia. The rectum is
unremarkable.

Musculoskeletal: Remote appearing L2 compression fracture with
approximately 60-70% loss of height. No retropulsion. No acute bone
abnormality.
IMPRESSION: No acute intra-abdominal pathology identified. No definite
radiographic explanation for the patient's reported symptoms.

Extensive calcification of the aortic valve leaflets.
Echocardiography may be helpful to assess for valvular dysfunction.

Moderate stool throughout the colon without evidence of obstruction.

Peripheral vascular disease.

Bilateral inguinal hernias. Single loop of unremarkable small bowel
within the right inguinal hernia.

Remote appearing L2 compression fracture with approximately 60-70%
loss of height.

Aortic Atherosclerosis ([id number]).

## 2021-09-13 ENCOUNTER — Ambulatory Visit (INDEPENDENT_AMBULATORY_CARE_PROVIDER_SITE_OTHER): Payer: Medicare Other | Admitting: Internal Medicine

## 2021-12-06 DIAGNOSIS — R972 Elevated prostate specific antigen [PSA]: Secondary | ICD-10-CM | POA: Diagnosis not present

## 2022-02-14 DIAGNOSIS — E785 Hyperlipidemia, unspecified: Secondary | ICD-10-CM | POA: Diagnosis not present

## 2022-02-14 DIAGNOSIS — R978 Other abnormal tumor markers: Secondary | ICD-10-CM | POA: Diagnosis not present

## 2022-02-14 DIAGNOSIS — E119 Type 2 diabetes mellitus without complications: Secondary | ICD-10-CM | POA: Diagnosis not present

## 2022-02-14 DIAGNOSIS — Z79899 Other long term (current) drug therapy: Secondary | ICD-10-CM | POA: Diagnosis not present

## 2022-02-14 DIAGNOSIS — I10 Essential (primary) hypertension: Secondary | ICD-10-CM | POA: Diagnosis not present

## 2022-02-22 DIAGNOSIS — I10 Essential (primary) hypertension: Secondary | ICD-10-CM | POA: Diagnosis not present

## 2022-02-22 DIAGNOSIS — E119 Type 2 diabetes mellitus without complications: Secondary | ICD-10-CM | POA: Diagnosis not present

## 2022-02-22 DIAGNOSIS — E785 Hyperlipidemia, unspecified: Secondary | ICD-10-CM | POA: Diagnosis not present

## 2022-03-14 DIAGNOSIS — R3129 Other microscopic hematuria: Secondary | ICD-10-CM | POA: Diagnosis not present

## 2022-03-14 DIAGNOSIS — R351 Nocturia: Secondary | ICD-10-CM | POA: Diagnosis not present

## 2022-03-14 DIAGNOSIS — R972 Elevated prostate specific antigen [PSA]: Secondary | ICD-10-CM | POA: Diagnosis not present

## 2022-03-14 DIAGNOSIS — N401 Enlarged prostate with lower urinary tract symptoms: Secondary | ICD-10-CM | POA: Diagnosis not present

## 2022-06-22 DIAGNOSIS — C4492 Squamous cell carcinoma of skin, unspecified: Secondary | ICD-10-CM | POA: Diagnosis not present

## 2022-06-28 DIAGNOSIS — C44629 Squamous cell carcinoma of skin of left upper limb, including shoulder: Secondary | ICD-10-CM | POA: Diagnosis not present

## 2022-06-28 DIAGNOSIS — Z79899 Other long term (current) drug therapy: Secondary | ICD-10-CM | POA: Diagnosis not present

## 2022-06-28 DIAGNOSIS — I10 Essential (primary) hypertension: Secondary | ICD-10-CM | POA: Diagnosis not present

## 2022-06-28 DIAGNOSIS — E7800 Pure hypercholesterolemia, unspecified: Secondary | ICD-10-CM | POA: Diagnosis not present

## 2022-06-28 DIAGNOSIS — E119 Type 2 diabetes mellitus without complications: Secondary | ICD-10-CM | POA: Diagnosis not present

## 2022-06-28 DIAGNOSIS — Z7984 Long term (current) use of oral hypoglycemic drugs: Secondary | ICD-10-CM | POA: Diagnosis not present

## 2022-06-30 DIAGNOSIS — E119 Type 2 diabetes mellitus without complications: Secondary | ICD-10-CM | POA: Diagnosis not present

## 2022-06-30 DIAGNOSIS — Z79899 Other long term (current) drug therapy: Secondary | ICD-10-CM | POA: Diagnosis not present

## 2022-06-30 DIAGNOSIS — E7800 Pure hypercholesterolemia, unspecified: Secondary | ICD-10-CM | POA: Diagnosis not present

## 2022-06-30 DIAGNOSIS — C44629 Squamous cell carcinoma of skin of left upper limb, including shoulder: Secondary | ICD-10-CM | POA: Diagnosis not present

## 2022-06-30 DIAGNOSIS — G8918 Other acute postprocedural pain: Secondary | ICD-10-CM | POA: Diagnosis not present

## 2022-06-30 DIAGNOSIS — Z7984 Long term (current) use of oral hypoglycemic drugs: Secondary | ICD-10-CM | POA: Diagnosis not present

## 2022-06-30 DIAGNOSIS — I10 Essential (primary) hypertension: Secondary | ICD-10-CM | POA: Diagnosis not present

## 2023-07-11 DIAGNOSIS — E119 Type 2 diabetes mellitus without complications: Secondary | ICD-10-CM | POA: Diagnosis not present

## 2023-07-11 DIAGNOSIS — I10 Essential (primary) hypertension: Secondary | ICD-10-CM | POA: Diagnosis not present

## 2023-07-11 DIAGNOSIS — E559 Vitamin D deficiency, unspecified: Secondary | ICD-10-CM | POA: Diagnosis not present

## 2023-07-11 DIAGNOSIS — E785 Hyperlipidemia, unspecified: Secondary | ICD-10-CM | POA: Diagnosis not present

## 2023-07-11 DIAGNOSIS — Z79899 Other long term (current) drug therapy: Secondary | ICD-10-CM | POA: Diagnosis not present

## 2023-07-11 DIAGNOSIS — R972 Elevated prostate specific antigen [PSA]: Secondary | ICD-10-CM | POA: Diagnosis not present

## 2023-07-24 DIAGNOSIS — R972 Elevated prostate specific antigen [PSA]: Secondary | ICD-10-CM | POA: Diagnosis not present

## 2023-07-24 DIAGNOSIS — E119 Type 2 diabetes mellitus without complications: Secondary | ICD-10-CM | POA: Diagnosis not present

## 2023-07-24 DIAGNOSIS — E559 Vitamin D deficiency, unspecified: Secondary | ICD-10-CM | POA: Diagnosis not present

## 2023-07-24 DIAGNOSIS — E785 Hyperlipidemia, unspecified: Secondary | ICD-10-CM | POA: Diagnosis not present

## 2023-07-24 DIAGNOSIS — I10 Essential (primary) hypertension: Secondary | ICD-10-CM | POA: Diagnosis not present

## 2023-07-27 DIAGNOSIS — H903 Sensorineural hearing loss, bilateral: Secondary | ICD-10-CM | POA: Diagnosis not present
